# Patient Record
Sex: Female | Born: 1945 | Hispanic: Yes | Marital: Married | Smoking: Never smoker
Health system: Southern US, Community
[De-identification: ages and names within clinical notes are randomized; demographics above are authoritative.]

## PROBLEM LIST (undated history)

## (undated) DIAGNOSIS — M8580 Other specified disorders of bone density and structure, unspecified site: Secondary | ICD-10-CM

## (undated) DIAGNOSIS — E039 Hypothyroidism, unspecified: Secondary | ICD-10-CM

## (undated) DIAGNOSIS — N209 Urinary calculus, unspecified: Secondary | ICD-10-CM

## (undated) DIAGNOSIS — E559 Vitamin D deficiency, unspecified: Secondary | ICD-10-CM

## (undated) HISTORY — DX: Other specified disorders of bone density and structure, unspecified site: M85.80

## (undated) HISTORY — DX: Hypothyroidism, unspecified: E03.9

## (undated) HISTORY — DX: Vitamin D deficiency, unspecified: E55.9

## (undated) HISTORY — PX: CHOLECYSTECTOMY: SHX55

## (undated) HISTORY — DX: Urinary calculus, unspecified: N20.9

---

## 1988-12-28 HISTORY — PX: ABDOMINAL HYSTERECTOMY: SHX81

## 2003-12-29 HISTORY — PX: GASTRIC BYPASS: SHX52

## 2006-11-16 ENCOUNTER — Ambulatory Visit: Payer: Self-pay | Admitting: Internal Medicine

## 2006-11-16 LAB — CONVERTED CEMR LAB
ALT: 23 units/L (ref 0–40)
Alkaline Phosphatase: 107 units/L (ref 39–117)
BUN: 20 mg/dL (ref 6–23)
Calcium: 9.5 mg/dL (ref 8.4–10.5)
Glucose, Bld: 89 mg/dL (ref 70–99)
HCT: 41.8 % (ref 36.0–46.0)
Hemoglobin: 14.1 g/dL (ref 12.0–15.0)
MCHC: 33.7 g/dL (ref 30.0–36.0)
MCV: 93.4 fL (ref 78.0–100.0)
Potassium: 4 meq/L (ref 3.5–5.1)
TSH: 5.19 microintl units/mL (ref 0.35–5.50)
Total Protein: 6.8 g/dL (ref 6.0–8.3)
Vitamin B-12: >1500 pg/mL — ABNORMAL HIGH (ref 211–911)
WBC: 6.2 10*3/uL (ref 4.5–10.5)

## 2006-12-08 ENCOUNTER — Encounter: Admission: RE | Admit: 2006-12-08 | Discharge: 2006-12-08 | Payer: Self-pay | Admitting: Internal Medicine

## 2006-12-13 ENCOUNTER — Ambulatory Visit: Payer: Self-pay | Admitting: Internal Medicine

## 2006-12-31 ENCOUNTER — Encounter: Admission: RE | Admit: 2006-12-31 | Discharge: 2006-12-31 | Payer: Self-pay | Admitting: Obstetrics and Gynecology

## 2007-01-03 ENCOUNTER — Ambulatory Visit: Payer: Self-pay | Admitting: Internal Medicine

## 2007-12-21 ENCOUNTER — Ambulatory Visit: Payer: Self-pay | Admitting: Internal Medicine

## 2007-12-21 DIAGNOSIS — Z9884 Bariatric surgery status: Secondary | ICD-10-CM

## 2007-12-21 DIAGNOSIS — M8580 Other specified disorders of bone density and structure, unspecified site: Secondary | ICD-10-CM

## 2007-12-21 DIAGNOSIS — H409 Unspecified glaucoma: Secondary | ICD-10-CM | POA: Insufficient documentation

## 2007-12-21 DIAGNOSIS — E039 Hypothyroidism, unspecified: Secondary | ICD-10-CM | POA: Insufficient documentation

## 2007-12-21 LAB — CONVERTED CEMR LAB
Basophils Relative: 0.7 % (ref 0.0–1.0)
CO2: 32 meq/L (ref 19–32)
Calcium: 9.5 mg/dL (ref 8.4–10.5)
Chloride: 106 meq/L (ref 96–112)
Creatinine, Ser: 0.5 mg/dL (ref 0.4–1.2)
Eosinophils Relative: 4.2 % (ref 0.0–5.0)
Glucose, Bld: 94 mg/dL (ref 70–99)
Iron: 83 ug/dL (ref 42–145)
Lymphocytes Relative: 46.5 % — ABNORMAL HIGH (ref 12.0–46.0)
Platelets: 266 10*3/uL (ref 150–400)
RBC: 4.22 M/uL (ref 3.87–5.11)
RDW: 12.4 % (ref 11.5–14.6)
TSH: 3.32 microintl units/mL (ref 0.35–5.50)
Triglycerides: 63 mg/dL (ref 0–149)
VLDL: 13 mg/dL (ref 0–40)
WBC: 5 10*3/uL (ref 4.5–10.5)

## 2007-12-27 ENCOUNTER — Ambulatory Visit: Payer: Self-pay | Admitting: Internal Medicine

## 2007-12-27 ENCOUNTER — Emergency Department (HOSPITAL_COMMUNITY): Admission: EM | Admit: 2007-12-27 | Discharge: 2007-12-27 | Payer: Self-pay | Admitting: Emergency Medicine

## 2007-12-30 ENCOUNTER — Telehealth: Payer: Self-pay | Admitting: Internal Medicine

## 2008-01-02 ENCOUNTER — Ambulatory Visit: Payer: Self-pay | Admitting: Internal Medicine

## 2008-01-06 ENCOUNTER — Encounter: Payer: Self-pay | Admitting: Internal Medicine

## 2008-01-06 ENCOUNTER — Other Ambulatory Visit: Admission: RE | Admit: 2008-01-06 | Discharge: 2008-01-06 | Payer: Self-pay | Admitting: Gynecology

## 2008-01-12 ENCOUNTER — Telehealth (INDEPENDENT_AMBULATORY_CARE_PROVIDER_SITE_OTHER): Payer: Self-pay | Admitting: *Deleted

## 2008-02-09 ENCOUNTER — Encounter: Admission: RE | Admit: 2008-02-09 | Discharge: 2008-02-09 | Payer: Self-pay | Admitting: Internal Medicine

## 2008-02-20 ENCOUNTER — Encounter: Admission: RE | Admit: 2008-02-20 | Discharge: 2008-02-20 | Payer: Self-pay | Admitting: Internal Medicine

## 2008-02-21 ENCOUNTER — Encounter (INDEPENDENT_AMBULATORY_CARE_PROVIDER_SITE_OTHER): Payer: Self-pay | Admitting: *Deleted

## 2008-07-02 ENCOUNTER — Ambulatory Visit: Payer: Self-pay | Admitting: Internal Medicine

## 2008-07-03 LAB — CONVERTED CEMR LAB
Chloride: 109 meq/L (ref 96–112)
GFR calc Af Amer: 131 mL/min
GFR calc non Af Amer: 108 mL/min
Potassium: 4.9 meq/L (ref 3.5–5.1)
TSH: 2.92 microintl units/mL (ref 0.35–5.50)

## 2009-01-04 ENCOUNTER — Ambulatory Visit: Payer: Self-pay | Admitting: Internal Medicine

## 2009-01-04 DIAGNOSIS — E875 Hyperkalemia: Secondary | ICD-10-CM | POA: Insufficient documentation

## 2009-01-22 LAB — CONVERTED CEMR LAB
ALT: 16 units/L (ref 0–35)
AST: 22 units/L (ref 0–37)
Creatinine, Ser: 0.4 mg/dL (ref 0.4–1.2)
HDL: 63.4 mg/dL (ref >39.0)
Potassium: 4.4 meq/L (ref 3.5–5.1)
Triglycerides: 94 mg/dL (ref 0–149)

## 2009-06-03 ENCOUNTER — Ambulatory Visit: Payer: Self-pay | Admitting: Family Medicine

## 2009-06-03 ENCOUNTER — Encounter: Payer: Self-pay | Admitting: Internal Medicine

## 2009-06-25 ENCOUNTER — Encounter (INDEPENDENT_AMBULATORY_CARE_PROVIDER_SITE_OTHER): Payer: Self-pay | Admitting: *Deleted

## 2009-06-27 ENCOUNTER — Encounter: Admission: RE | Admit: 2009-06-27 | Discharge: 2009-06-27 | Payer: Self-pay | Admitting: Internal Medicine

## 2009-07-04 ENCOUNTER — Encounter (INDEPENDENT_AMBULATORY_CARE_PROVIDER_SITE_OTHER): Payer: Self-pay | Admitting: *Deleted

## 2009-09-06 ENCOUNTER — Ambulatory Visit: Payer: Self-pay | Admitting: Internal Medicine

## 2009-09-09 ENCOUNTER — Encounter (INDEPENDENT_AMBULATORY_CARE_PROVIDER_SITE_OTHER): Payer: Self-pay | Admitting: *Deleted

## 2009-09-11 LAB — CONVERTED CEMR LAB
ALT: 20 units/L (ref 0–35)
AST: 27 units/L (ref 0–37)
BUN: 15 mg/dL (ref 6–23)
Basophils Absolute: 0 10*3/uL (ref 0.0–0.1)
Basophils Relative: 0 % (ref 0.0–3.0)
CO2: 31 meq/L (ref 19–32)
Calcium: 9.2 mg/dL (ref 8.4–10.5)
Cholesterol: 174 mg/dL (ref 0–200)
Creatinine, Ser: 0.5 mg/dL (ref 0.4–1.2)
Eosinophils Absolute: 0.1 10*3/uL (ref 0.0–0.7)
HDL: 59.7 mg/dL (ref >39.00)
Lymphocytes Relative: 47 % — ABNORMAL HIGH (ref 12.0–46.0)
MCHC: 33.9 g/dL (ref 30.0–36.0)
MCV: 92.5 fL (ref 78.0–100.0)
Monocytes Absolute: 0.2 10*3/uL (ref 0.1–1.0)
Neutrophils Relative %: 46.7 % (ref 43.0–77.0)
Platelets: 225 10*3/uL (ref 150.0–400.0)
RBC: 4.47 M/uL (ref 3.87–5.11)
Rh Type: NEGATIVE
Total CHOL/HDL Ratio: 3
Triglycerides: 87 mg/dL (ref 0.0–149.0)
Vit D, 25-Hydroxy: 11 ng/mL — ABNORMAL LOW (ref 30–89)

## 2009-09-30 ENCOUNTER — Encounter: Payer: Self-pay | Admitting: Internal Medicine

## 2009-10-11 ENCOUNTER — Encounter: Payer: Self-pay | Admitting: Internal Medicine

## 2009-10-11 LAB — CONVERTED CEMR LAB

## 2009-10-25 ENCOUNTER — Ambulatory Visit: Payer: Self-pay | Admitting: Internal Medicine

## 2010-04-24 ENCOUNTER — Ambulatory Visit: Payer: Self-pay | Admitting: Internal Medicine

## 2010-04-29 LAB — CONVERTED CEMR LAB
CO2: 31 meq/L (ref 19–32)
Chloride: 106 meq/L (ref 96–112)
Creatinine, Ser: 0.5 mg/dL (ref 0.4–1.2)
Potassium: 4.6 meq/L (ref 3.5–5.1)
Sodium: 144 meq/L (ref 135–145)
TSH: 2.4 microintl units/mL (ref 0.35–5.50)

## 2010-06-27 ENCOUNTER — Ambulatory Visit: Payer: Self-pay | Admitting: Internal Medicine

## 2010-06-27 ENCOUNTER — Encounter (INDEPENDENT_AMBULATORY_CARE_PROVIDER_SITE_OTHER): Payer: Self-pay | Admitting: *Deleted

## 2010-06-27 DIAGNOSIS — R197 Diarrhea, unspecified: Secondary | ICD-10-CM

## 2010-10-14 ENCOUNTER — Ambulatory Visit: Payer: Self-pay | Admitting: Diagnostic Radiology

## 2010-10-14 ENCOUNTER — Ambulatory Visit (HOSPITAL_BASED_OUTPATIENT_CLINIC_OR_DEPARTMENT_OTHER): Admission: RE | Admit: 2010-10-14 | Discharge: 2010-10-14 | Payer: Self-pay | Admitting: Internal Medicine

## 2010-10-14 LAB — HM MAMMOGRAPHY: HM Mammogram: NEGATIVE

## 2011-01-15 ENCOUNTER — Encounter: Payer: Self-pay | Admitting: Internal Medicine

## 2011-01-18 ENCOUNTER — Encounter: Payer: Self-pay | Admitting: Obstetrics and Gynecology

## 2011-01-18 ENCOUNTER — Encounter: Payer: Self-pay | Admitting: Internal Medicine

## 2011-01-26 ENCOUNTER — Encounter: Payer: Self-pay | Admitting: Internal Medicine

## 2011-01-26 ENCOUNTER — Other Ambulatory Visit: Payer: Self-pay | Admitting: Gastroenterology

## 2011-01-27 NOTE — Assessment & Plan Note (Signed)
Summary: 6 MONTH OV//PH   Vital Signs:  Patient profile:   65 year old female Height:      60.5 inches Weight:      149.5 pounds BMI:     28.82 Pulse rate:   62 / minute BP sitting:   104 / 60  Vitals Entered By: Shary Decamp (April 24, 2010 7:57 AM) CC: rov   History of Present Illness: routine office visit several months history of pain on the right side of the neck and upper trapezoid area;  patient thinks is related to the posture at work----> bends forward  constantly  Current Medications (verified): 1)  Synthroid 50 Mcg  Tabs (Levothyroxine Sodium) .Marland Kitchen.. 1 By Mouth Qd 2)  Ca and Vit D  Allergies (verified): No Known Drug Allergies  Past History:  Past Medical History: Reviewed history from 01/04/2009 and no changes required. Hypothyroidism glaucoma ?urolithiasis 2007, 2009  Osteopenia  Past Surgical History: Reviewed history from 09/06/2009 and no changes required. 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   Neck:  full ROM.  no tender to palpation at the cervical spine Lungs:  normal respiratory effort, no intercostal retractions, no accessory muscle use, and normal breath sounds.   Heart:  normal rate, regular rhythm, no murmur, and no gallop.   Msk:  not tender to palpation at the trapezoid  muscle area Neurologic:  alert & oriented X3, strength normal in all extremities, and DTRs symmetrical and normal.     Impression &  Recommendations:  Problem # 1:  NECK PAIN (ICD-723.1)  see HPI, likely related to posture recommend Motrin and Flexeril at bedtime  discussed appropriate posture at work if not better, advised to   call for a referral (Dr. Christell Faith)  Her updated medication list for this problem includes:    Flexeril 10 Mg Tabs (Cyclobenzaprine hcl) ..... One by mouth at night as needed for pain30  Problem # 2:  HYPERKALEMIA (ICD-276.7)  repeat BMP  Orders: Venipuncture (33295) TLB-BMP (Basic Metabolic Panel-BMET) (80048-METABOL)  Problem # 3:  OSTEOPENIA (ICD-733.90)  Dexa 12-2006 and 05-2009: osteopenia  05-2009 Tscore -0.9 and -1.5 counseled about Ca and Vit D  and option of medication her last vitamin D was low, will re-issue ergocalciferol prescription  Her updated medication list for this problem includes:    Ergocalciferol 50000 Unit Caps (Ergocalciferol) ..... One tablet weekly for 3 months  Problem # 4:  HYPOTHYROIDISM (ICD-244.9)  Her updated medication list for this problem includes:    Synthroid 50 Mcg Tabs (Levothyroxine sodium) .Marland Kitchen... 1 by mouth qd  Labs Reviewed: TSH: 0.59 (09/06/2009)    Chol: 174 (09/06/2009)   HDL: 59.70 (09/06/2009)   LDL: 97 (09/06/2009)   TG: 87.0 (09/06/2009)  Orders: TLB-TSH (Thyroid Stimulating Hormone) (84443-TSH)  Problem # 5:  get records from gynecology  Complete Medication List: 1)  Synthroid 50 Mcg Tabs (Levothyroxine sodium) .Marland Kitchen.. 1 by mouth qd 2)  Ca and Vit D  3)  Ergocalciferol 50000 Unit Caps (Ergocalciferol) .Marland KitchenMarland KitchenMarland Kitchen  One tablet weekly for 3 months 4)  Flexeril 10 Mg Tabs (Cyclobenzaprine hcl) .... One by mouth at night as needed for pain30  Patient Instructions: 1)  Please schedule a follow-up appointment in 6 months (fasting, physical) Prescriptions: SYNTHROID 50 MCG  TABS (LEVOTHYROXINE SODIUM) 1 by mouth QD  #90 x 3   Entered and Authorized by:   Nolon Rod. Youssouf Shipley MD   Signed by:   Nolon Rod. Raul Winterhalter MD on 04/24/2010   Method used:   Print then  Give to Patient   RxID:   [id number] FLEXERIL 10 MG TABS (CYCLOBENZAPRINE HCL) one by mouth at night as needed for pain30  #30 x 1   Entered and Authorized by:   Elita Quick E. Juan Kissoon MD   Signed by:   Nolon Rod. Bralynn Donado MD on 04/24/2010   Method used:   Print then Give to Patient   RxID:   [id number] ERGOCALCIFEROL 50000 UNIT CAPS (ERGOCALCIFEROL) one tablet weekly for 3 months  #12 x 0   Entered and Authorized by:   Nolon Rod. Kendyl Bissonnette MD   Signed by:   Nolon Rod. Aikeem Lilley MD on 04/24/2010   Method used:   Print then Give to Patient   RxID:   [id number]

## 2011-01-27 NOTE — Assessment & Plan Note (Signed)
Summary: walk-in/diarhhea//kn   Vital Signs:  Patient profile:   65 year old female Weight:      146.50 pounds Temp:     98.6 degrees F oral Pulse rhythm:   regular BP sitting:   112 / 74  (left arm) Cuff size:   regular  Vitals Entered By: Army Fossa CMA (June 27, 2010 8:24 AM)  O2 Flow:  64 CC: C/o diarrhea since Tuesday- getting worse. Comments No vomitting.   History of Present Illness: watery diarrhea for 4 days No blood in the stools, no mucus No recent  trips outside the city, no other family members or coworkers affected w/ diarrhea  that she knows of patient is drinking plenty of fluids, has not taken any medicines other than what is prescribed  ROS No fever No nausea or vomiting Some headaches Appetite decrease very mild abdominal discomfort  Allergies (verified): No Known Drug Allergies  Past History:  Past Medical History: Reviewed history from 01/04/2009 and no changes required. Hypothyroidism glaucoma ?urolithiasis 2007, 2009  Osteopenia  Past Surgical History: Reviewed history from 09/06/2009 and no changes required. gastric bypass 2005, [place]  Cholecystectomy Hysterectomy , no oophorectomy (1990s)  Social History: Reviewed history from 04/24/2010 and no changes required. 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  nontoxic Mouth:  membranes slightly dry Abdomen:  soft, normal bowel sounds, no distention, no masses, no guarding, and no rigidity.  very mild tenderness throughout   Impression & Recommendations:  Problem # 1:  DIARRHEA (ICD-787.91) acute diarrhea Probably viral, anticipate it will get better soon Plan: Rest, fluids, Pepto-Bismol, will call if no better or go to the UC  if worse. see  instructions in Spanish  Complete Medication List: 1)  Synthroid 50 Mcg Tabs (Levothyroxine  sodium) .Marland Kitchen.. 1 by mouth qd 2)  Ca and Vit D  3)  Ergocalciferol 50000 Unit Caps (Ergocalciferol) .... One tablet weekly for 3 months 4)  Flexeril 10 Mg Tabs (Cyclobenzaprine hcl) .... One by mouth at night as needed for pain30  Patient Instructions: 1)  descanse 2)  tome muchos liquidos: caldo de pollo, Gatorade, crakers 3)  peptobismol OTC  4)  llame si no esta mejor en unos dias 5)  vaya al "Urgent Care" este fin de semana si se pone peor

## 2011-01-27 NOTE — Letter (Signed)
Summary: Out of Work  [facility]  [address] [place], [place] [address]   Phone: [phone]  Fax: [phone]    June 27, 2010   Employee:  REINA WILTON    To Whom It May Concern:   For Medical reasons, please excuse the above named employee from work for the following dates:  Start:   June 24, 2010  End:   June 27, 2010  If you need additional information, please feel free to contact our office.         Sincerely,    Willow Ora, MD

## 2011-01-27 NOTE — Miscellaneous (Signed)
Summary: PAP from 10-10  Clinical Lists Changes  Observations: Added new observation of PAP SMEAR: Specimen Adequacy: Satisfactory for evaluation.   Interpretation/Result:Negative for intraepithelial Lesion or Malignancy.    (10/11/2009 8:15)      Pap Smear  Procedure date:  10/11/2009  Findings:      Specimen Adequacy: Satisfactory for evaluation.   Interpretation/Result:Negative for intraepithelial Lesion or Malignancy.      Pap Smear  Procedure date:  10/11/2009  Findings:      Specimen Adequacy: Satisfactory for evaluation.   Interpretation/Result:Negative for intraepithelial Lesion or Malignancy.

## 2011-01-29 ENCOUNTER — Encounter: Payer: Self-pay | Admitting: Internal Medicine

## 2011-01-30 ENCOUNTER — Ambulatory Visit
Admission: RE | Admit: 2011-01-30 | Discharge: 2011-01-30 | Disposition: A | Discharge status: Home / Self Care | Payer: Self-pay | Source: Ambulatory Visit | Attending: Gastroenterology | Admitting: Gastroenterology

## 2011-01-30 ENCOUNTER — Encounter: Payer: Self-pay | Admitting: Internal Medicine

## 2011-02-12 NOTE — Letter (Signed)
Summary: RX EGD  CScope----Dr Loreta Ave, GI  [facility]   Imported By: Lanelle Bal 01/29/2011 09:05:57  _____________________________________________________________________  External Attachment:    Type:   Image     Comment:   External Document

## 2011-02-18 NOTE — Procedures (Signed)
Summary: Colonoscopy-- hemorrhoid, tics  Colonoscopy/[facility]   Imported By: Sherian Rein 02/03/2011 13:57:44  _____________________________________________________________________  External Attachment:    Type:   Image     Comment:   External Document

## 2011-02-18 NOTE — Procedures (Signed)
Summary: EGD-- pseudo diverticulum? see report   EGD/[facility]   Imported By: Sherian Rein 02/03/2011 13:59:02  _____________________________________________________________________  External Attachment:    Type:   Image     Comment:   External Document

## 2011-02-19 ENCOUNTER — Encounter: Payer: Self-pay | Admitting: Internal Medicine

## 2011-02-25 ENCOUNTER — Encounter: Payer: Self-pay | Admitting: Internal Medicine

## 2011-03-24 ENCOUNTER — Encounter: Payer: Self-pay | Admitting: Internal Medicine

## 2011-03-24 ENCOUNTER — Ambulatory Visit (INDEPENDENT_AMBULATORY_CARE_PROVIDER_SITE_OTHER): Payer: BC Managed Care – PPO | Admitting: Internal Medicine

## 2011-03-24 DIAGNOSIS — M899 Disorder of bone, unspecified: Secondary | ICD-10-CM

## 2011-03-24 DIAGNOSIS — R209 Unspecified disturbances of skin sensation: Secondary | ICD-10-CM

## 2011-03-24 DIAGNOSIS — M949 Disorder of cartilage, unspecified: Secondary | ICD-10-CM

## 2011-03-24 DIAGNOSIS — R202 Paresthesia of skin: Secondary | ICD-10-CM | POA: Insufficient documentation

## 2011-03-24 DIAGNOSIS — E039 Hypothyroidism, unspecified: Secondary | ICD-10-CM

## 2011-03-24 DIAGNOSIS — Z0000 Encounter for general adult medical examination without abnormal findings: Secondary | ICD-10-CM | POA: Insufficient documentation

## 2011-03-24 LAB — LIPID PANEL
Cholesterol: 157 mg/dL (ref 0–200)
Triglycerides: 79 mg/dL (ref 0.0–149.0)

## 2011-03-24 LAB — BASIC METABOLIC PANEL
Calcium: 9.2 mg/dL (ref 8.4–10.5)
GFR: 126.43 mL/min (ref >60.00)
Potassium: 5.3 mEq/L — ABNORMAL HIGH (ref 3.5–5.1)
Sodium: 143 mEq/L (ref 135–145)

## 2011-03-24 LAB — TSH: TSH: 1.75 u[IU]/mL (ref 0.35–5.50)

## 2011-03-24 LAB — ALT: ALT: 17 U/L (ref 0–35)

## 2011-03-24 MED ORDER — ZOSTER VACCINE LIVE 19400 UNT/0.65ML ~~LOC~~ SOLR
0.6500 mL | Freq: Once | SUBCUTANEOUS | Status: AC
  Administered 2011-03-24: 19400 [IU] via SUBCUTANEOUS

## 2011-03-24 NOTE — Assessment & Plan Note (Addendum)
Td 2007 Shingles inmunization discussed -- gave one today Cscope 2005, no polyps per patient ([place])  , again had a Cscope w/ Dr Loreta Ave 12-2010, tics, no polyps, next in 10 years per report  last  MMG neg 09-2010 s/p hyst: no h/o cancer or abnormal PAP, saw Dr Ernestina Penna 2010 and DR Neva Seat [phone] for her female exam Labs: had a CBC LFTs 12-2010 at GI diet-exercise discussed

## 2011-03-24 NOTE — Assessment & Plan Note (Signed)
New problem Neurological exam normal Has a mild on and off neck pain without radiation. Carpal tunnel?  cubital tunnel syndrome?  Will prescribe CTS splinter,  if not better consider further workup such as nerve conduction study

## 2011-03-24 NOTE — Assessment & Plan Note (Signed)
Due for labs

## 2011-03-24 NOTE — Progress Notes (Signed)
  Subjective:    Patient ID: Angela Valencia, female    DOB: Aug 14, 1946, 65 y.o.   MRN: [id number]  HPI CPX  S/p GI eval, for dysphagia , had a Cscope , results reviewed Also complaints of left hand numbness, mostly first thing in the morning, gradually resolve.  the numbness is in both sides of the hand, she has neck pain from time to time without radiation. Denies any elbow pain or injury.   due for a TSH check     Review of Systems  Constitutional: Negative for fever and fatigue.  Respiratory: Negative for cough and shortness of breath.   Cardiovascular: Negative for chest pain and palpitations.  Gastrointestinal: Negative for nausea, vomiting, diarrhea and blood in stool.  Genitourinary: Negative for dysuria and hematuria.  Psychiatric/Behavioral:       No anxiety , depression      Past Medical History  Diagnosis Date  . Urolithiasis 2007,2009  . Hypothyroidism   . Osteopenia   . Glaucoma     Past Surgical History  Procedure Date  . Gastric bypass 2005    [place]  . Cholecystectomy   . Abdominal hysterectomy 1990    no oophorectomy   Family History: Reviewed history from 12/21/2007 and no changes required. Stroke - F +skin Ca CAD +? DM-no Colon Ca - no Breast Ca - no  Social History: Married 2 kids from [place] Tobacco-never ETOH-- rarely         Objective:   Physical Exam  Constitutional: She appears well-developed and well-nourished.  Neck: Normal range of motion. Neck supple.  Cardiovascular: Normal rate, regular rhythm and normal heart sounds.   Pulmonary/Chest: Effort normal and breath sounds normal. No respiratory distress. She has no wheezes. She has no rales.  Abdominal: Bowel sounds are normal. She exhibits no distension and no mass. There is no tenderness. There is no rebound and no guarding.  Musculoskeletal:        Shoulders and elbows wait normal range of motion.  Elbows and wrists normal to inspection and palpation.  Neurological:         Upper extremities with normal strength, motor tone and DTRs          Assessment & Plan:

## 2011-03-24 NOTE — Patient Instructions (Signed)
Get a carpal tunnel syndrome splinter from the pharmacy, use it in your left hand for a month every night. If numbness is not improving, let me know  please read the information about healthy diet  exercise 30 minutes daily

## 2011-03-24 NOTE — Assessment & Plan Note (Signed)
Dexa 12-2006 and 05-2009: osteopenia  05-2009 Tscore -0.9 and -1.5 counseled about Ca and Vit D  and option of medication H/o low  vitamin D,  Was Rx ergocalciferol   Labs

## 2011-03-26 ENCOUNTER — Telehealth: Payer: Self-pay | Admitting: Internal Medicine

## 2011-03-26 MED ORDER — ERGOCALCIFEROL 1.25 MG (50000 UT) PO CAPS: 50000.0000 [IU] | ORAL_CAPSULE | ORAL | Status: DC

## 2011-03-26 MED ORDER — LEVOTHYROXINE SODIUM 50 MCG PO TABS: 50.0000 ug | ORAL_TABLET | Freq: Every day | ORAL | Status: DC

## 2011-03-26 NOTE — Telephone Encounter (Signed)
I spoke with the patient's  Husband with the patient's permission --TSH okay, RF synthroid --potassium is slightly elevated, I recommend to discontinue the banana that she eats daily, will also mail  a low potassium diet --She is to come back in one month for a K+check, DX hyperkalemia. They will call for an appointment --Her vitamin D is low, I am sending a prescription for ergocalciferol. Patient aware

## 2011-03-27 ENCOUNTER — Other Ambulatory Visit: Payer: Self-pay | Admitting: Internal Medicine

## 2011-03-27 MED ORDER — VITAMIN B-12 100 MCG PO TABS: 100.0000 ug | ORAL_TABLET | Freq: Every day | ORAL | Status: DC

## 2011-03-27 MED ORDER — VITAMIN B-12 100 MCG PO TABS: 50.0000 ug | ORAL_TABLET | Freq: Every day | ORAL | Status: DC

## 2011-03-27 NOTE — Telephone Encounter (Signed)
I think I just  RF it, if I didn't : ok  to call 6 months

## 2011-03-27 NOTE — Telephone Encounter (Signed)
Originally ordered Vit B12 .  The lowest strength is .  Please resubmith with correct strength

## 2011-04-27 ENCOUNTER — Other Ambulatory Visit (INDEPENDENT_AMBULATORY_CARE_PROVIDER_SITE_OTHER): Payer: BC Managed Care – PPO

## 2011-04-27 DIAGNOSIS — E875 Hyperkalemia: Secondary | ICD-10-CM

## 2011-04-27 LAB — POTASSIUM: Potassium: 4.5 mEq/L (ref 3.5–5.1)

## 2011-04-29 ENCOUNTER — Encounter: Payer: Self-pay | Admitting: Internal Medicine

## 2011-05-08 NOTE — Progress Notes (Signed)
Addended by: Floydene Flock on: 05/08/2011 11:51 AM   Modules accepted: Orders

## 2011-05-15 NOTE — Assessment & Plan Note (Signed)
[facility]                          [facility] OFFICE NOTE   NAME:[place], [place]                         MRN:          [id number]  DATE:11/16/2006                            DOB:          05-29-1946    CHIEF COMPLAINT:  Here to establish and complete physical.   HISTORY:  Mrs. Harkless is a 65 year old Hispanic female originally from  [place] who comes to the office for the first time to get established and  get a complete physical.  The last time she saw a doctor was two years ago  when she was living in [place].  In general, she is doing well.  She did  report occasional low back pain and a two year history of occasionally  feeling hot, slightly dizzy and diaphoretic.  She reports no chest pain or  focal neurological symptoms during these spells that last about 5 minutes.   PAST MEDICAL HISTORY:  1. Hypothyroidism.  2. Glaucoma.  3. In 2005 she had gastric bypass.  Her baseline weight was 220 pounds.  4. Hysterectomy without oophorectomy, remotely.  5. Cholecystectomy.  6. Reports a colonoscopy in 2005.  She was told that she had no polyps.  7. She went to the emergency room earlier this year and she was told that      she had kidney stones.   FAMILY HISTORY:  1. No breast or colon cancer that the patient knows of.  2. No diabetes.  3. Questionable history of coronary artery disease in the family.  4. Her father had a stroke at age 68.   SOCIAL HISTORY:  The patient does not smoke, drinks very rarely.  She is  married and has two kids.   MEDICATIONS:  1. Synthroid 50 mcg once a day. She has been skipping a lot lately because      she didn't have her supply of medicines.  2. Alphagan for glaucoma.   REVIEW OF SYSTEMS:  She denies any chest pain, shortness of breath, cough,  nausea, vomiting, diarrhea, no vaginal discharge or bleeding, no dysuria or  bladder incontinence.   ALLERGIES:  No known drug allergies.   PHYSICAL  EXAMINATION:  GENERAL APPEARANCE:  The patient is alert, oriented  and in no apparent distress.  She weighs 137 pounds.  She is 5 feet, 3/4  inches tall.  VITAL SIGNS:  Pulse 56, blood pressure 122/80.  HEENT:  Oropharynx is normal.  Ears are normal.  NECK:  No thyromegaly.  LUNGS:  Clear to auscultation bilaterally.  CARDIOVASCULAR:  Regular rate and rhythm without murmurs.  Carotids are  palpate and they feel normal.  ABDOMEN:  Soft, not distended, good bowel sounds, no organomegaly.  I  believe I am able to palpate the aorta which is nontender and there is no  bruit.  EXTREMITIES: No edema.  GENITOURINARY/BREAST:  No particular masses or axillary lymphadenopathy.  NEUROLOGICAL:  Speech, gait and motor are intact.   ASSESSMENT/PLAN:  1. The patient is here for a complete physical examination.  2. Will provide her with a flu shot and a tetanus shot.  3. She is encouraged to have a low fat diet and exercise daily.  4. Dental check up yearly is also encouraged.  5. Will refer her for mammogram and will ask her to get the records for      her colonoscopy.  6. Electrocardiogram was normal.  7. Will check CBC, comprehensive panel, TSH, fasting lipid profile,      urinalysis, B12 and folic acid levels.  8. For her glaucoma, will refer her to a local ophthalmologist.  9. For her thyroid disease, will check the level today.  Refilled her      Synthroid 50 mcg and asked her to come back in six weeks as most likely      she will need a recheck of thyroid then.  10.She has episodes of what seems to be hot flashes.  At this time will      simply observe.     Willow Ora, MD  Electronically Signed    JP/MedQ  DD: 11/16/2006  DT: 11/16/2006  Job #: [id number]

## 2011-05-29 NOTE — Progress Notes (Signed)
K+ is now ok.

## 2011-07-22 ENCOUNTER — Encounter: Payer: Self-pay | Admitting: *Deleted

## 2011-08-19 ENCOUNTER — Other Ambulatory Visit: Payer: Self-pay | Admitting: *Deleted

## 2011-08-19 MED ORDER — LEVOTHYROXINE SODIUM 50 MCG PO TABS: 50.0000 ug | ORAL_TABLET | Freq: Every day | ORAL | Status: DC

## 2011-09-18 ENCOUNTER — Encounter: Payer: Self-pay | Admitting: Internal Medicine

## 2011-09-18 ENCOUNTER — Ambulatory Visit (INDEPENDENT_AMBULATORY_CARE_PROVIDER_SITE_OTHER): Payer: BC Managed Care – PPO | Admitting: Internal Medicine

## 2011-09-18 DIAGNOSIS — E039 Hypothyroidism, unspecified: Secondary | ICD-10-CM

## 2011-09-18 DIAGNOSIS — E559 Vitamin D deficiency, unspecified: Secondary | ICD-10-CM

## 2011-09-18 DIAGNOSIS — M899 Disorder of bone, unspecified: Secondary | ICD-10-CM

## 2011-09-18 DIAGNOSIS — M949 Disorder of cartilage, unspecified: Secondary | ICD-10-CM

## 2011-09-18 NOTE — Progress Notes (Signed)
  Subjective:    Patient ID: Angela Valencia, female    DOB: 12/23/1946, 65 y.o.   MRN: [id number]  HPI  Routine office visit, doing well, has gained some weight, recognizes is exercising less lately    Past Medical History  Diagnosis Date  . Urolithiasis 2007,2009  . Hypothyroidism   . Osteopenia   . Glaucoma    Past Surgical History  Procedure Date  . Gastric bypass 2005    [place]  . Cholecystectomy   . Abdominal hysterectomy 1990    no oophorectomy   Review of Systems Good medication compliance with vitamin D as prescribed. Good medication compliance with Synthroid. some stress, her husband (who is my patient) has some health issues. We discussed them, counseled Follows a low potassium diet, potassium was elevated at some point a few months ago      Objective:   Physical Exam  Constitutional: She is oriented to person, place, and time. She appears well-developed and well-nourished.  HENT:  Head: Normocephalic and atraumatic.  Cardiovascular: Normal rate, regular rhythm and normal heart sounds.   No murmur heard. Pulmonary/Chest: Effort normal and breath sounds normal.  Neurological: She is alert and oriented to person, place, and time.  Psychiatric: She has a normal mood and affect. Her behavior is normal. Judgment and thought content normal.          Assessment & Plan:

## 2011-09-18 NOTE — Patient Instructions (Signed)
rec flu shot in October

## 2011-09-18 NOTE — Assessment & Plan Note (Signed)
Labs vitamin  D low, status post  replacement, recheck. Encourage exercise

## 2011-09-18 NOTE — Assessment & Plan Note (Signed)
Good medication compliance, well controlled  

## 2011-09-24 ENCOUNTER — Telehealth: Payer: Self-pay | Admitting: *Deleted

## 2011-09-24 MED ORDER — ERGOCALCIFEROL 1.25 MG (50000 UT) PO CAPS: 50000.0000 [IU] | ORAL_CAPSULE | ORAL | Status: DC

## 2011-09-24 NOTE — Telephone Encounter (Signed)
Patient Informed. New Rx sent to local pharmacy. Results Mailed.

## 2011-09-24 NOTE — Telephone Encounter (Signed)
Message copied by Regis Bill on Thu Sep 24, 2011  4:24 PM ------      Message from: Angela Valencia      Created: Tue Sep 22, 2011  6:02 PM       Advise patient, continue with low vitamin D.      Call in  ergocalciferol 50,000 units one tablet weekly for 3 months #12 no refills.      After ergocalciferol, she needs to take vitamin D over-the-counter 1000 units daily.

## 2011-10-02 LAB — DIFFERENTIAL
Basophils Absolute: 0.1
Basophils Relative: 1
Eosinophils Absolute: 0.1
Eosinophils Relative: 2
Lymphocytes Relative: 31
Monocytes Absolute: 0.3

## 2011-10-02 LAB — BASIC METABOLIC PANEL
BUN: 16
CO2: 29
GFR calc non Af Amer: >60
Glucose, Bld: 92
Potassium: 4.4

## 2011-10-02 LAB — POCT CARDIAC MARKERS
Myoglobin, poc: 32.8
Operator id: 198171
Operator id: 294501
Troponin i, poc: <0.05
Troponin i, poc: <0.05

## 2011-10-02 LAB — CBC
HCT: 35 — ABNORMAL LOW
MCHC: 34.7
MCV: 89.5
Platelets: 250
RDW: 12.9

## 2011-10-02 LAB — D-DIMER, QUANTITATIVE: D-Dimer, Quant: 0.42

## 2011-11-30 ENCOUNTER — Other Ambulatory Visit: Payer: Self-pay | Admitting: Internal Medicine

## 2011-11-30 ENCOUNTER — Ambulatory Visit (HOSPITAL_BASED_OUTPATIENT_CLINIC_OR_DEPARTMENT_OTHER)
Admission: RE | Admit: 2011-11-30 | Discharge: 2011-11-30 | Disposition: A | Discharge status: Home / Self Care | Payer: BC Managed Care – PPO | Source: Ambulatory Visit | Attending: Internal Medicine | Admitting: Internal Medicine

## 2011-11-30 DIAGNOSIS — Z1231 Encounter for screening mammogram for malignant neoplasm of breast: Secondary | ICD-10-CM | POA: Insufficient documentation

## 2012-03-17 ENCOUNTER — Encounter: Payer: Self-pay | Admitting: Internal Medicine

## 2012-03-17 ENCOUNTER — Ambulatory Visit (INDEPENDENT_AMBULATORY_CARE_PROVIDER_SITE_OTHER): Payer: BC Managed Care – PPO | Admitting: Internal Medicine

## 2012-03-17 DIAGNOSIS — H409 Unspecified glaucoma: Secondary | ICD-10-CM

## 2012-03-17 DIAGNOSIS — E039 Hypothyroidism, unspecified: Secondary | ICD-10-CM

## 2012-03-17 DIAGNOSIS — M899 Disorder of bone, unspecified: Secondary | ICD-10-CM

## 2012-03-17 DIAGNOSIS — Z0000 Encounter for general adult medical examination without abnormal findings: Secondary | ICD-10-CM

## 2012-03-17 DIAGNOSIS — Z9884 Bariatric surgery status: Secondary | ICD-10-CM

## 2012-03-17 LAB — CBC WITH DIFFERENTIAL/PLATELET
Basophils Absolute: 0.1 10*3/uL (ref 0.0–0.1)
Eosinophils Relative: 4.2 % (ref 0.0–5.0)
Lymphocytes Relative: 38.9 % (ref 12.0–46.0)
Monocytes Relative: 3.5 % (ref 3.0–12.0)
Neutrophils Relative %: 52 % (ref 43.0–77.0)
Platelets: 253 10*3/uL (ref 150.0–400.0)
RDW: 14.1 % (ref 11.5–14.6)
WBC: 5.4 10*3/uL (ref 4.5–10.5)

## 2012-03-17 LAB — COMPREHENSIVE METABOLIC PANEL
ALT: 16 U/L (ref 0–35)
CO2: 30 mEq/L (ref 19–32)
Calcium: 8.9 mg/dL (ref 8.4–10.5)
Chloride: 102 mEq/L (ref 96–112)
GFR: 113.37 mL/min (ref >60.00)
Glucose, Bld: 89 mg/dL (ref 70–99)
Sodium: 138 mEq/L (ref 135–145)
Total Bilirubin: 0.4 mg/dL (ref 0.3–1.2)
Total Protein: 6.9 g/dL (ref 6.0–8.3)

## 2012-03-17 LAB — LIPID PANEL
Cholesterol: 167 mg/dL (ref 0–200)
HDL: 64.4 mg/dL (ref >39.00)
LDL Cholesterol: 86 mg/dL (ref 0–99)
Triglycerides: 85 mg/dL (ref 0.0–149.0)
VLDL: 17 mg/dL (ref 0.0–40.0)

## 2012-03-17 MED ORDER — LEVOTHYROXINE SODIUM 50 MCG PO TABS: 50.0000 ug | ORAL_TABLET | Freq: Every day | ORAL | Status: DC

## 2012-03-17 NOTE — Assessment & Plan Note (Addendum)
Td 2007 Shingles inmunization 2012 Cscope 2005, no polyps per patient ([place])  , again had a Cscope w/ Dr Loreta Ave 12-2010, tics, no polyps, next in 10 years per report  last  MMG neg 11-2011 s/p hyst: no h/o cancer or abnormal PAP, saw Dr Ernestina Penna 2010 and DR Neva Seat [phone] and ~ 10-2011 diet-exercise discussed  Scalp lesion likely a lipoma, advised patient to call me if it is increasing in size or hurting Labs

## 2012-03-17 NOTE — Progress Notes (Signed)
  Subjective:    Patient ID: Angela Valencia, female    DOB: 1946/06/17, 66 y.o.   MRN: [id number]  HPI CPX  Past Medical History  Diagnosis Date  . Urolithiasis 2007,2009  . Hypothyroidism   . Osteopenia   . Glaucoma    Past Surgical History  Procedure Date  . Gastric bypass 2005    [place]  . Cholecystectomy   . Abdominal hysterectomy 1990    no oophorectomy   History   Social History  . Marital Status: Married    Spouse Name: N/A    Number of Children: 2  . 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 Smoking status: Never Smoker   . Smokeless tobacco: Never Used  . Alcohol Use: Yes     rarely  . Drug Use: Not on file  . Sexually Active: Not on file   Other Topics Concern  . Not on file   Social History Narrative   From [place], married to [employer] x 38 years, husband has dementia, diet improved lately , exercises very rarely (no time)   Family History  Problem Relation Age of Onset  . Stroke Father   . Skin cancer    . Coronary artery disease Neg Hx   . Diabetes Neg Hx   . Cancer Neg Hx     colon,prostate, breast    Review of Systems has noted a "knot" in the  scalp, concerned about it. No chest pain, shortness of breath. No nausea, vomiting or diarrhea. No blood in the stools. No dysuria or gross hematuria. Nonstenotic aortic depression although she recognizes she is sometimes stressed out by her husband's dementia    Objective:   Physical Exam  General -- alert, well-developed, and well-nourished.   Neck --no thyromegaly , normal carotid pulse, no LADs Lungs -- normal respiratory effort, no intercostal retractions, no accessory muscle use, and normal breath sounds.   Heart-- normal rate, regular rhythm, no murmur, and no gallop.   Abdomen--soft, non-tender, no distention, no masses, no HSM, no guarding, and no rigidity.   Extremities-- no pretibial edema bilaterally Skin-- at the R side  of the scalp has a 1 cm soft mass, mobile , not attached to deeper streuctures (likely a lipoma) Neurologic-- alert & oriented X3 and strength normal in all extremities. Psych-- Cognition and judgment appear intact. Alert and cooperative with normal attention span and concentration.  not anxious appearing and not depressed appearing.       Assessment & Plan:

## 2012-03-17 NOTE — Assessment & Plan Note (Signed)
Good compliance with medicines. Labs

## 2012-03-17 NOTE — Assessment & Plan Note (Addendum)
Her eye doctor d/c drops, recommend to followup with him/her to be sure the pressure is ok

## 2012-03-17 NOTE — Assessment & Plan Note (Signed)
Will check vitamins, also c/o occ difficulty swallowing Plan-- refer to Dr Loreta Ave, dx dysphagia

## 2012-03-17 NOTE — Assessment & Plan Note (Signed)
Status post vitamin D replacement. Last bone density test 2010, will order a bone density test. Check vitamin D

## 2012-03-18 LAB — VITAMIN D 25 HYDROXY (VIT D DEFICIENCY, FRACTURES): Vit D, 25-Hydroxy: 23 ng/mL — ABNORMAL LOW (ref 30–89)

## 2012-03-21 MED ORDER — ERGOCALCIFEROL 1.25 MG (50000 UT) PO CAPS: 50000.0000 [IU] | ORAL_CAPSULE | ORAL | Status: DC

## 2012-03-21 NOTE — Progress Notes (Signed)
Addended by: Edwena Felty T on: 03/21/2012 05:16 PM   Modules accepted: Orders

## 2012-03-24 ENCOUNTER — Other Ambulatory Visit: Payer: Self-pay | Admitting: Internal Medicine

## 2012-03-24 NOTE — Telephone Encounter (Signed)
Refill done.  

## 2012-03-28 ENCOUNTER — Ambulatory Visit (INDEPENDENT_AMBULATORY_CARE_PROVIDER_SITE_OTHER)
Admission: RE | Admit: 2012-03-28 | Discharge: 2012-03-28 | Disposition: A | Discharge status: Home / Self Care | Payer: BC Managed Care – PPO | Source: Ambulatory Visit

## 2012-03-28 DIAGNOSIS — M899 Disorder of bone, unspecified: Secondary | ICD-10-CM

## 2012-04-28 ENCOUNTER — Ambulatory Visit (HOSPITAL_BASED_OUTPATIENT_CLINIC_OR_DEPARTMENT_OTHER)
Admission: RE | Admit: 2012-04-28 | Discharge: 2012-04-28 | Disposition: A | Discharge status: Home / Self Care | Payer: BC Managed Care – PPO | Source: Ambulatory Visit | Attending: Internal Medicine | Admitting: Internal Medicine

## 2012-04-28 ENCOUNTER — Ambulatory Visit (INDEPENDENT_AMBULATORY_CARE_PROVIDER_SITE_OTHER): Payer: BC Managed Care – PPO | Admitting: Internal Medicine

## 2012-04-28 ENCOUNTER — Inpatient Hospital Stay: Admission: RE | Admit: 2012-04-28 | Payer: BC Managed Care – PPO | Source: Ambulatory Visit

## 2012-04-28 VITALS — BP 130/68 | HR 56 | Temp 97.6°F | Wt 151.0 lb

## 2012-04-28 DIAGNOSIS — F29 Unspecified psychosis not due to a substance or known physiological condition: Secondary | ICD-10-CM | POA: Insufficient documentation

## 2012-04-28 DIAGNOSIS — R5381 Other malaise: Secondary | ICD-10-CM | POA: Insufficient documentation

## 2012-04-28 DIAGNOSIS — R51 Headache: Secondary | ICD-10-CM | POA: Insufficient documentation

## 2012-04-28 DIAGNOSIS — R42 Dizziness and giddiness: Secondary | ICD-10-CM | POA: Insufficient documentation

## 2012-04-28 DIAGNOSIS — J3489 Other specified disorders of nose and nasal sinuses: Secondary | ICD-10-CM | POA: Insufficient documentation

## 2012-04-28 DIAGNOSIS — G319 Degenerative disease of nervous system, unspecified: Secondary | ICD-10-CM

## 2012-04-28 DIAGNOSIS — G930 Cerebral cysts: Secondary | ICD-10-CM | POA: Insufficient documentation

## 2012-04-28 MED ORDER — MECLIZINE HCL 12.5 MG PO TABS: 12.5000 mg | ORAL_TABLET | Freq: Three times a day (TID) | ORAL | Status: AC | PRN

## 2012-04-28 NOTE — Patient Instructions (Signed)
Rest, fluids, Tylenol. Antivert as needed for dizziness, may make you sleepy. Go to the ER if your symptoms are severe.

## 2012-04-28 NOTE — Progress Notes (Signed)
  Subjective:    Patient ID: Angela Valencia, female    DOB: 09/30/1946, 66 y.o.   MRN: [id number]  HPI Acute visit Yesterday, she developed vertigo for a few hours, described as the room spinning, slightly worse when she moves her head, better when she laid down in bed and stay quiet. Along with the vertigo, she had mild headache, she rarely has headache and this particular episode is not the worst of her life. She also had nausea. Her husband check her blood pressure and it was 139/82. Today, she had headache again without vertigo for about 2 hours, at this point the headache is gone.  Past Medical History  Diagnosis Date  . Urolithiasis 2007,2009  . Hypothyroidism   . Osteopenia   . Glaucoma    Past Surgical History  Procedure Date  . Gastric bypass 2005    [place]  . Cholecystectomy   . Abdominal hysterectomy 1990    no oophorectomy     Review of Systems No fever chills. No diplopia, motor deficits, slurred speech. No vomiting or diarrhea. No sinus congestion, runny nose, sneezing, itchy eyes or itchy nose.     Objective:   Physical Exam General -- alert, well-developed. No apparent distress.  Neck --FROM, normal carotid pulses HEENT -- TMs normal, throat w/o redness, face symmetric and not tender to palpation, nose not congested  Lungs -- normal respiratory effort, no intercostal retractions, no accessory muscle use, and normal breath sounds.   Heart-- normal rate, regular rhythm, no murmur, and no gallop.   Extremities-- no pretibial edema bilaterally  Neurologic-- alert & oriented X3, CNs normal, motor-DTRs symmetric, gait normal, speech fluent. Psych-- Cognition and judgment appear intact. Alert and cooperative with normal attention span and concentration.  not anxious appearing and not depressed appearing.          Assessment & Plan:   Headache, vertigo: Headache and vertigo of unclear etiology (although vertigo fits more a peripheral description) , symptoms  are now subsided. Neurological examination and review of systems is essentially negative for red flags. She is a low risk for cardiovascular events. Plan: CT of the head, if his CT is negative will recommend conservative treatment with rest, Tylenol and Antivert. Patient advised to go to the ER-- even if the CT is negative--- if she has severe symptoms. She verbalized understanding.

## 2012-05-01 ENCOUNTER — Encounter: Payer: Self-pay | Admitting: Internal Medicine

## 2012-05-12 ENCOUNTER — Telehealth: Payer: Self-pay | Admitting: Internal Medicine

## 2012-05-12 NOTE — Telephone Encounter (Signed)
dexa 02-2012 showed osteopenia w/ a T score of -1.4 (last T score -1.5 in 2010)  Please send a letter:  Your bone density is stable , I recommend daily supplements of calcium, vit D and exercise. Su densiometria osea esta estable, tiene "osteopenia". Le recomiendo tomar diariamente calcio, vitamina D y hacer ejercicio.

## 2012-05-13 ENCOUNTER — Encounter: Payer: Self-pay | Admitting: *Deleted

## 2012-05-13 NOTE — Telephone Encounter (Signed)
Mailed letter °

## 2012-09-19 ENCOUNTER — Ambulatory Visit (INDEPENDENT_AMBULATORY_CARE_PROVIDER_SITE_OTHER): Payer: BC Managed Care – PPO | Admitting: Internal Medicine

## 2012-09-19 VITALS — BP 128/74 | HR 61 | Temp 97.8°F | Wt 148.0 lb

## 2012-09-19 DIAGNOSIS — M546 Pain in thoracic spine: Secondary | ICD-10-CM

## 2012-09-19 DIAGNOSIS — M1990 Unspecified osteoarthritis, unspecified site: Secondary | ICD-10-CM | POA: Insufficient documentation

## 2012-09-19 DIAGNOSIS — M949 Disorder of cartilage, unspecified: Secondary | ICD-10-CM

## 2012-09-19 DIAGNOSIS — K222 Esophageal obstruction: Secondary | ICD-10-CM | POA: Insufficient documentation

## 2012-09-19 DIAGNOSIS — M899 Disorder of bone, unspecified: Secondary | ICD-10-CM

## 2012-09-19 NOTE — Assessment & Plan Note (Signed)
DEXA 02-2012 showed osteopenia again.  Vitamin D  was low, status post ergocalciferol 50,000 unit, currently on over-the-counter calcium and vitamin D.

## 2012-09-19 NOTE — Assessment & Plan Note (Signed)
3 weeks history of thoracic back pain without radiation Plan: X-ray Tylenol

## 2012-09-19 NOTE — Patient Instructions (Addendum)
Tylenol  500 mg OTC 2 tabs a day every 8 hours as needed for pain If the pain continue or gets worse let me know ( referral) --- Please get your x-ray at the other [facility]  office located at: [address] [place], across from [facility]  Please go to the basement, this is a walk-in facility, they are open from 8:30 to 5:30 PM. Phone number [phone].-

## 2012-09-19 NOTE — Progress Notes (Signed)
  Subjective:    Patient ID: Angela Valencia, female    DOB: [birth year], 66 y.o.   MRN: [id number]  HPI Routine office visit Was seen a few months ago with headaches, CT negative, headaches resolved. Also, she had dysphagia, saw GI, chart reviewed, see assessment and plan. Good compliance with Synthroid. Complains of back pain for 3 weeks, located in the mid thoracic area, no radiation, intense and sharp pain with certain movements, otherwise a mild ache on and off.  Past Medical History  Diagnosis Date  . Urolithiasis 2007,2009  . Hypothyroidism   . Osteopenia   . Glaucoma    Past Surgical History  Procedure Date  . Gastric bypass 2005    [place]  . Cholecystectomy   . Abdominal hysterectomy 1990    no oophorectomy     Review of Systems Denies any recent injury. No fever chills, no weight loss. No bladder or bowel incontinence. Also few months ago was diagnosed with low vitamin D, status post replacement, currently on over-the-counter vitamin D and calcium.    Objective:   Physical Exam General -- alert, well-developed, and well-nourished.    Extremities-- no pretibial edema bilaterally Musculoskeletal --not tender to palpation at the thoracic spine Neurologic-- alert & oriented X3 , DTRsand strength normal in all extremities. Posture not antalgic, gait normal. Straight leg test negative. Psych-- Cognition and judgment appear intact. Alert and cooperative with normal attention span and concentration.  not anxious appearing and not depressed appearing.       Assessment & Plan:

## 2012-09-19 NOTE — Assessment & Plan Note (Signed)
Refer to GI a few months ago d/t  dysphagia, Dr. Loreta Ave performed a EGD and dilatation. Since then, symptoms are about the same, she self discontinue PPIs, denies heartburn per se. Plan: Observation for now, should symptoms increase, needs to see GI again.

## 2012-09-20 ENCOUNTER — Encounter: Payer: Self-pay | Admitting: Internal Medicine

## 2012-09-27 ENCOUNTER — Ambulatory Visit (INDEPENDENT_AMBULATORY_CARE_PROVIDER_SITE_OTHER)
Admission: RE | Admit: 2012-09-27 | Discharge: 2012-09-27 | Disposition: A | Discharge status: Home / Self Care | Payer: BC Managed Care – PPO | Source: Ambulatory Visit | Attending: Internal Medicine | Admitting: Internal Medicine

## 2012-09-27 DIAGNOSIS — M546 Pain in thoracic spine: Secondary | ICD-10-CM

## 2012-09-30 ENCOUNTER — Encounter: Payer: Self-pay | Admitting: Internal Medicine

## 2012-09-30 ENCOUNTER — Ambulatory Visit (INDEPENDENT_AMBULATORY_CARE_PROVIDER_SITE_OTHER): Payer: BC Managed Care – PPO | Admitting: Internal Medicine

## 2012-09-30 VITALS — BP 136/72 | HR 67 | Temp 98.2°F | Wt 148.0 lb

## 2012-09-30 DIAGNOSIS — E039 Hypothyroidism, unspecified: Secondary | ICD-10-CM

## 2012-09-30 DIAGNOSIS — E559 Vitamin D deficiency, unspecified: Secondary | ICD-10-CM

## 2012-09-30 DIAGNOSIS — M546 Pain in thoracic spine: Secondary | ICD-10-CM

## 2012-09-30 HISTORY — DX: Vitamin D deficiency, unspecified: E55.9

## 2012-09-30 LAB — TSH: TSH: 1.187 u[IU]/mL (ref 0.350–4.500)

## 2012-09-30 NOTE — Progress Notes (Signed)
  Subjective:    Patient ID: Angela Valencia, female    DOB: 1946-10-02, 66 y.o.   MRN: [id number]  HPI Acute visit Was seen several days ago with her thoracic back pain, x-ray was negative. Pain is about the same, Tylenol does help a little bit. The pain is worse with physical activity.  Past medical history Kidney stones Hypothyroidism Osteopenia Glaucoma  Past surgical history Gastric bypass 2005 in [place] Cholecystectomy Abdominal hysterectomy in the 90s, no oophorectomy  Review of Systems Again she denies any bladder or bowel incontinence, fever or chills. No recent back injury. As far as her chronic medical problems, she took ergocalciferol for decreased vitamin D. Good compliance with Synthroid.     Objective:   Physical Exam  General -- alert, well-developed, and well-nourished.  Extremities-- no pretibial edema bilaterally  Musculoskeletal --not tender to palpation at the thoracic spine  Neurologic-- alert & oriented X3 , DTRsand strength normal in all extremities. Posture not antalgic, gait normal. Straight leg test negative.  Psych--   not anxious appearing and not depressed appearing.      Assessment & Plan:

## 2012-09-30 NOTE — Assessment & Plan Note (Signed)
Status post ergocalciferol, currently on over-the-counter ca and vitamin D. Check levels.

## 2012-09-30 NOTE — Assessment & Plan Note (Signed)
Pain has mechanical features, x-ray was negative, as if pain continues will refer to orthopedic surgery. ( [place]) Declined pain med Rx

## 2012-09-30 NOTE — Assessment & Plan Note (Signed)
good medication compliance Labs  

## 2012-10-05 ENCOUNTER — Encounter: Payer: Self-pay | Admitting: *Deleted

## 2012-10-05 LAB — VITAMIN D 1,25 DIHYDROXY
Vitamin D 1, 25 (OH)2 Total: 92 pg/mL — ABNORMAL HIGH (ref 18–72)
Vitamin D2 1, 25 (OH)2: 53 pg/mL
Vitamin D3 1, 25 (OH)2: 39 pg/mL

## 2012-12-01 ENCOUNTER — Other Ambulatory Visit: Payer: Self-pay | Admitting: Internal Medicine

## 2012-12-01 DIAGNOSIS — Z1231 Encounter for screening mammogram for malignant neoplasm of breast: Secondary | ICD-10-CM

## 2012-12-07 ENCOUNTER — Ambulatory Visit (HOSPITAL_BASED_OUTPATIENT_CLINIC_OR_DEPARTMENT_OTHER)
Admission: RE | Admit: 2012-12-07 | Discharge: 2012-12-07 | Disposition: A | Discharge status: Home / Self Care | Payer: BC Managed Care – PPO | Source: Ambulatory Visit | Attending: Internal Medicine | Admitting: Internal Medicine

## 2012-12-07 DIAGNOSIS — Z1231 Encounter for screening mammogram for malignant neoplasm of breast: Secondary | ICD-10-CM | POA: Insufficient documentation

## 2013-04-05 ENCOUNTER — Ambulatory Visit (INDEPENDENT_AMBULATORY_CARE_PROVIDER_SITE_OTHER): Payer: Medicare Other | Admitting: Internal Medicine

## 2013-04-05 ENCOUNTER — Encounter: Payer: Self-pay | Admitting: Internal Medicine

## 2013-04-05 VITALS — BP 126/74 | HR 60 | Temp 98.1°F | Ht 60.0 in | Wt 144.0 lb

## 2013-04-05 DIAGNOSIS — I8390 Asymptomatic varicose veins of unspecified lower extremity: Secondary | ICD-10-CM

## 2013-04-05 DIAGNOSIS — E559 Vitamin D deficiency, unspecified: Secondary | ICD-10-CM

## 2013-04-05 DIAGNOSIS — R945 Abnormal results of liver function studies: Secondary | ICD-10-CM | POA: Insufficient documentation

## 2013-04-05 DIAGNOSIS — I8393 Asymptomatic varicose veins of bilateral lower extremities: Secondary | ICD-10-CM

## 2013-04-05 DIAGNOSIS — E039 Hypothyroidism, unspecified: Secondary | ICD-10-CM

## 2013-04-05 DIAGNOSIS — M1990 Unspecified osteoarthritis, unspecified site: Secondary | ICD-10-CM

## 2013-04-05 DIAGNOSIS — Z0000 Encounter for general adult medical examination without abnormal findings: Secondary | ICD-10-CM

## 2013-04-05 DIAGNOSIS — M899 Disorder of bone, unspecified: Secondary | ICD-10-CM

## 2013-04-05 DIAGNOSIS — Z23 Encounter for immunization: Secondary | ICD-10-CM

## 2013-04-05 DIAGNOSIS — R7989 Other specified abnormal findings of blood chemistry: Secondary | ICD-10-CM | POA: Insufficient documentation

## 2013-04-05 LAB — HEPATIC FUNCTION PANEL
AST: 22 U/L (ref 0–37)
Albumin: 3.8 g/dL (ref 3.5–5.2)

## 2013-04-05 LAB — TSH: TSH: 3.41 u[IU]/mL (ref 0.35–5.50)

## 2013-04-05 MED ORDER — LEVOTHYROXINE SODIUM 50 MCG PO TABS: 50.0000 ug | ORAL_TABLET | Freq: Every day | ORAL | Status: DC

## 2013-04-05 MED ORDER — MELOXICAM 7.5 MG PO TABS: 7.5000 mg | ORAL_TABLET | Freq: Every day | ORAL | Status: DC

## 2013-04-05 NOTE — Assessment & Plan Note (Signed)
asx

## 2013-04-05 NOTE — Assessment & Plan Note (Signed)
RF meds and labs

## 2013-04-05 NOTE — Assessment & Plan Note (Signed)
rec Ca and Vit D H/o low vit D Plan Labs, DEXA

## 2013-04-05 NOTE — Assessment & Plan Note (Signed)
AP elevated before, re check

## 2013-04-05 NOTE — Assessment & Plan Note (Addendum)
Td 2007 Shingles inmunization 2012 Pneumonia shot today  Cscope 2005, no polyps per patient ([place])  , again had a Cscope w/ Dr Loreta Ave 12-2010, tics, no polyps, next in 10 years per report  last  MMG neg 11-2012 s/p hyst: no h/o cancer or abnormal PAP, saw  DrGreene 11-2012 diet-exercise discussed  labs from 3-13 reviewed: normal renal fx, no anemia, normal cholesterol

## 2013-04-05 NOTE — Assessment & Plan Note (Signed)
Saw ortho, dx w/  DJD, s/p PT, on meloxicam, helps, likes a RF RF done, GI s/e discussed

## 2013-04-05 NOTE — Progress Notes (Signed)
  Subjective:    Patient ID: Angela Valencia, female    DOB: [birth year], 67 y.o.   MRN: [id number]  HPI Here for Medicare AWV:  1. Risk factors based on Past M, S, F history: reviewed 2. Physical Activities: plans to join a gym  3. Depression/mood:  (-) screening  4. Hearing:  No problemss noted or reported  5. ADL's:  Completely independent 6. Fall Risk: low, see instructions  7. home Safety: does feel safe at home  8. Height, weight, &visual acuity: see VS, sees eye doctor 9. Counseling: provided 10. Labs ordered based on risk factors: if needed  11. Referral Coordination: if needed 12.  Care Plan, see assessment and plan  13.   Cognitive Assessment: motor skills and cognition wnl  In addition, today we discussed the following: Thyroid-- good med compliance except x 3 days Neck pain-- saw ortho, dx w/ DJD, meloxicam helped, did PT. Likes a RF meloxicam, denies GI s/e Varicose veins-- denies pain-swelling    Past Medical History  Diagnosis Date  . Urolithiasis 2007,2009  . Hypothyroidism   . Osteopenia   . Glaucoma(365)    Past Surgical History  Procedure Laterality Date  . Gastric bypass  2005    [place]  . Cholecystectomy    . Abdominal hysterectomy  1990    no oophorectomy   History   Social History  . Marital Status: Married    Spouse Name: N/A    Number of Children: 2  . Years of Education: N/A   Occupational History  . 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     Social History Main Topics  . Smoking status: Never Smoker   . Smokeless tobacco: Never Used  . Alcohol Use: Yes     Comment: rarely  . Drug Use: No  . Sexually Active: Not on file   Other Topics Concern  . Not on file   Social History Narrative   From [place], married to [place] x 39 years, husband has dementia, improved lately.   Diet-- trying to eat healthy   Exercise-- plans to star a gym program   Family History  Problem Relation Age of Onset  . Stroke Father   . Coronary artery  disease Neg Hx   . Diabetes Neg Hx   . Cancer Neg Hx     colon,prostate, breast  . Stomach cancer Other     uncle  . Diabetes Neg Hx      Review of Systems  Respiratory: Negative for cough and shortness of breath.   Cardiovascular: Negative for chest pain and leg swelling.  Gastrointestinal: Negative for abdominal pain and blood in stool.  Genitourinary: Negative for dysuria, hematuria, vaginal bleeding and difficulty urinating.       Objective:   Physical Exam  General -- alert, well-developed, NAD  Neck --no thyromegaly , normal carotid pulse  Lungs -- normal respiratory effort, no intercostal retractions, no accessory muscle use, and normal breath sounds.   Heart-- normal rate, regular rhythm, no murmur, and no gallop.   Abdomen--soft, non-tender, no distention, no masses, no HSM, no guarding, and no rigidity.   Extremities-- no pretibial edema bilaterally; some varicose veins w/o evidence of phlebitis  Neurologic-- alert & oriented X3 and strength normal in all extremities. Psych-- Cognition and judgment appear intact. Alert and cooperative with normal attention span and concentration.  not anxious appearing and not depressed appearing.       Assessment & Plan:

## 2013-04-05 NOTE — Patient Instructions (Signed)
Take meloxicam as needed for pain. Always take it with food. Watch for stomach side effects (gastritis): nausea, stomach pain, change in the color of stools.  Fall Prevention and Home Safety Falls cause injuries and can affect all age groups. It is possible to use preventive measures to significantly decrease the likelihood of falls. There are many simple measures which can make your home safer and prevent falls. OUTDOORS  Repair cracks and edges of walkways and driveways.  Remove high doorway thresholds.  Trim shrubbery on the main path into your home.  Have good outside lighting.  Clear walkways of tools, rocks, debris, and clutter.  Check that handrails are not broken and are securely fastened. Both sides of steps should have handrails.  Have leaves, snow, and ice cleared regularly.  Use sand or salt on walkways during winter months.  In the garage, clean up grease or oil spills. BATHROOM  Install night lights.  Install grab bars by the toilet and in the tub and shower.  Use non-skid mats or decals in the tub or shower.  Place a plastic non-slip stool in the shower to sit on, if needed.  Keep floors dry and clean up all water on the floor immediately.  Remove soap buildup in the tub or shower on a regular basis.  Secure bath mats with non-slip, double-sided rug tape.  Remove throw rugs and tripping hazards from the floors. BEDROOMS  Install night lights.  Make sure a bedside light is easy to reach.  Do not use oversized bedding.  Keep a telephone by your bedside.  Have a firm chair with side arms to use for getting dressed.  Remove throw rugs and tripping hazards from the floor. KITCHEN  Keep handles on pots and pans turned toward the center of the stove. Use back burners when possible.  Clean up spills quickly and allow time for drying.  Avoid walking on wet floors.  Avoid hot utensils and knives.  Position shelves so they are not too high or  low.  Place commonly used objects within easy reach.  If necessary, use a sturdy step stool with a grab bar when reaching.  Keep electrical cables out of the way.  Do not use floor polish or wax that makes floors slippery. If you must use wax, use non-skid floor wax.  Remove throw rugs and tripping hazards from the floor. STAIRWAYS  Never leave objects on stairs.  Place handrails on both sides of stairways and use them. Fix any loose handrails. Make sure handrails on both sides of the stairways are as long as the stairs.  Check carpeting to make sure it is firmly attached along stairs. Make repairs to worn or loose carpet promptly.  Avoid placing throw rugs at the top or bottom of stairways, or properly secure the rug with carpet tape to prevent slippage. Get rid of throw rugs, if possible.  Have an electrician put in a light switch at the top and bottom of the stairs. OTHER FALL PREVENTION TIPS  Wear low-heel or rubber-soled shoes that are supportive and fit well. Wear closed toe shoes.  When using a stepladder, make sure it is fully opened and both spreaders are firmly locked. Do not climb a closed stepladder.  Add color or contrast paint or tape to grab bars and handrails in your home. Place contrasting color strips on first and last steps.  Learn and use mobility aids as needed. Install an electrical emergency response system.  Turn on lights to avoid  dark areas. Replace light bulbs that burn out immediately. Get light switches that glow.  Arrange furniture to create clear pathways. Keep furniture in the same place.  Firmly attach carpet with non-skid or double-sided tape.  Eliminate uneven floor surfaces.  Select a carpet pattern that does not visually hide the edge of steps.  Be aware of all pets. OTHER HOME SAFETY TIPS  Set the water temperature for 120 F (48.8 C).  Keep emergency numbers on or near the telephone.  Keep smoke detectors on every level of the  home and near sleeping areas. Document Released: 12/04/2002 Document Revised: 06/14/2012 Document Reviewed: 03/04/2012 [facility] Patient Information 2013 [place], [place].

## 2013-04-10 ENCOUNTER — Encounter: Payer: Self-pay | Admitting: *Deleted

## 2013-04-10 LAB — VITAMIN D 1,25 DIHYDROXY
Vitamin D 1, 25 (OH)2 Total: 78 pg/mL — ABNORMAL HIGH (ref 18–72)
Vitamin D3 1, 25 (OH)2: 46 pg/mL

## 2013-04-13 IMAGING — CR DG THORACIC SPINE 3V
3 series · 3 of 3 positions shown · non-contrast
Comparison: Chest CTA 12/27/2007.

CLINICAL DATA: 65-year-old female with back pain.

THORACIC SPINE - 2 VIEW + SWIMMERS

[view not recorded (1 of 3)]
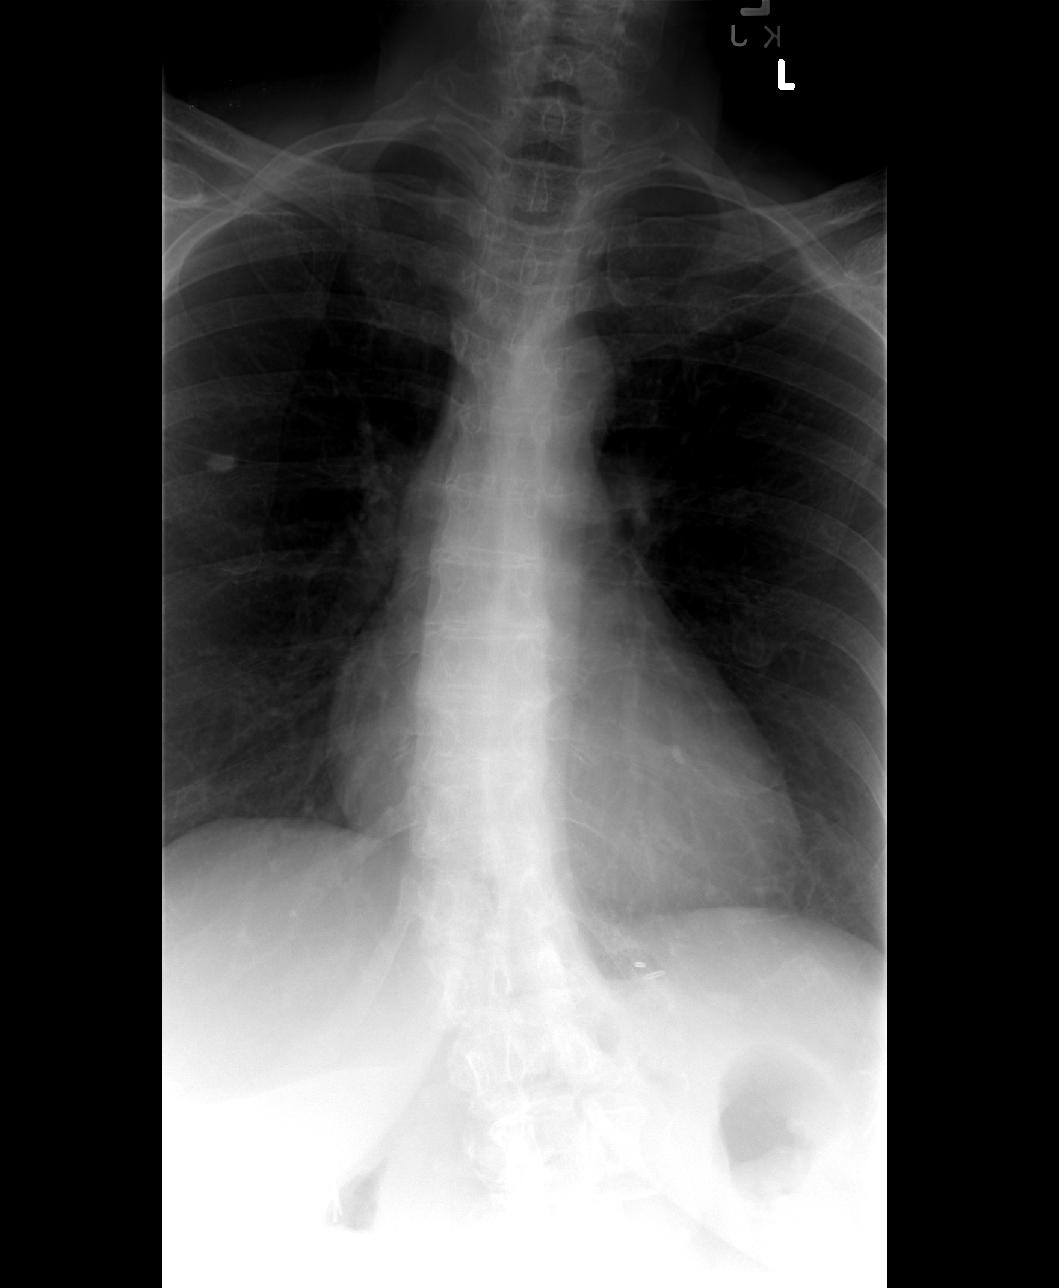

[view not recorded (2 of 3)]
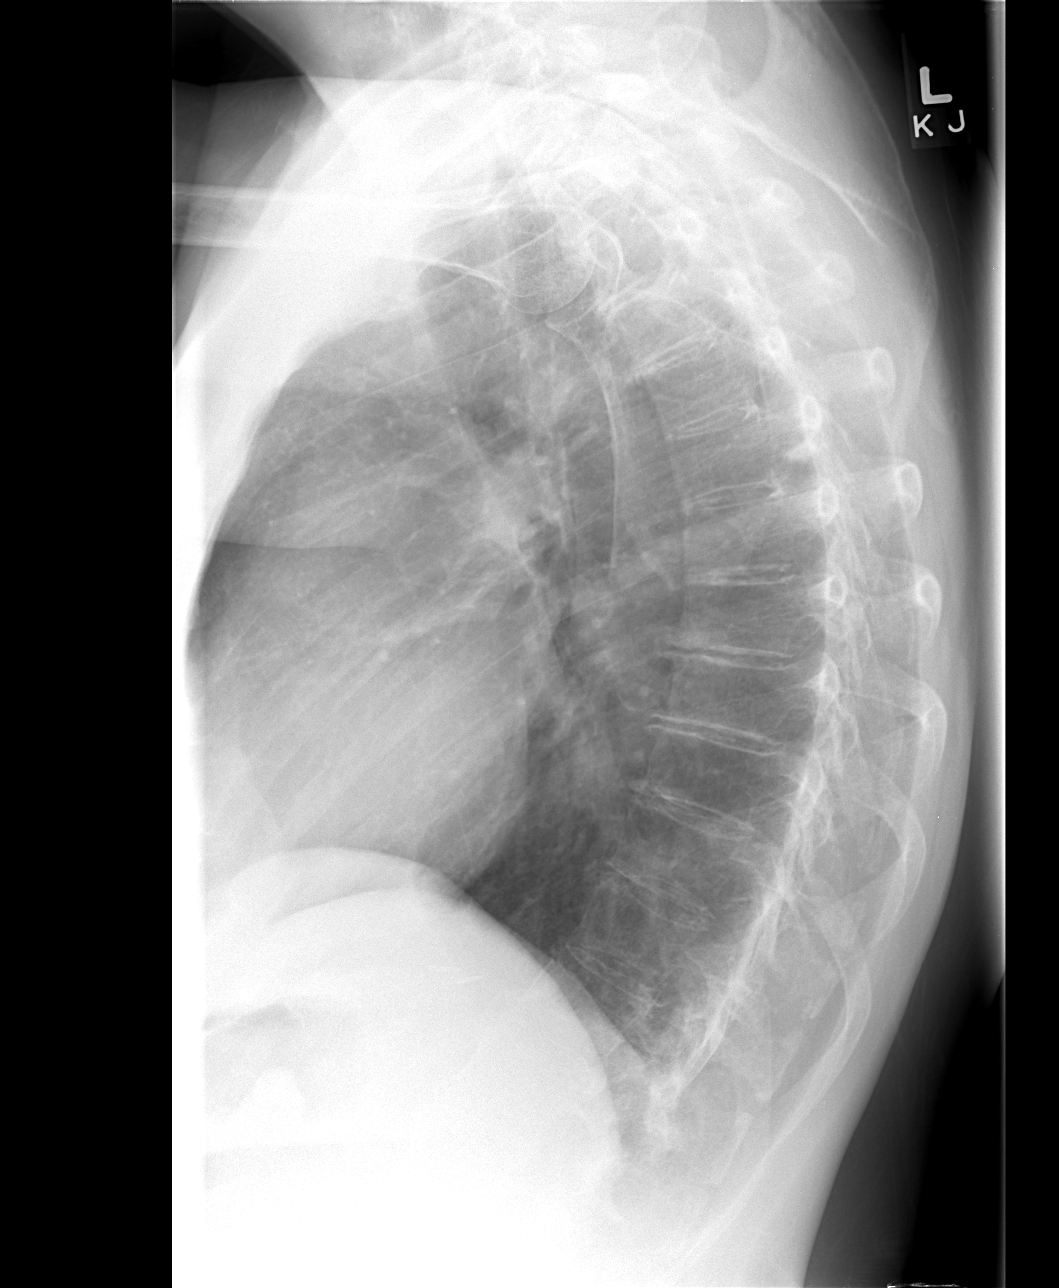

[view not recorded (3 of 3)]
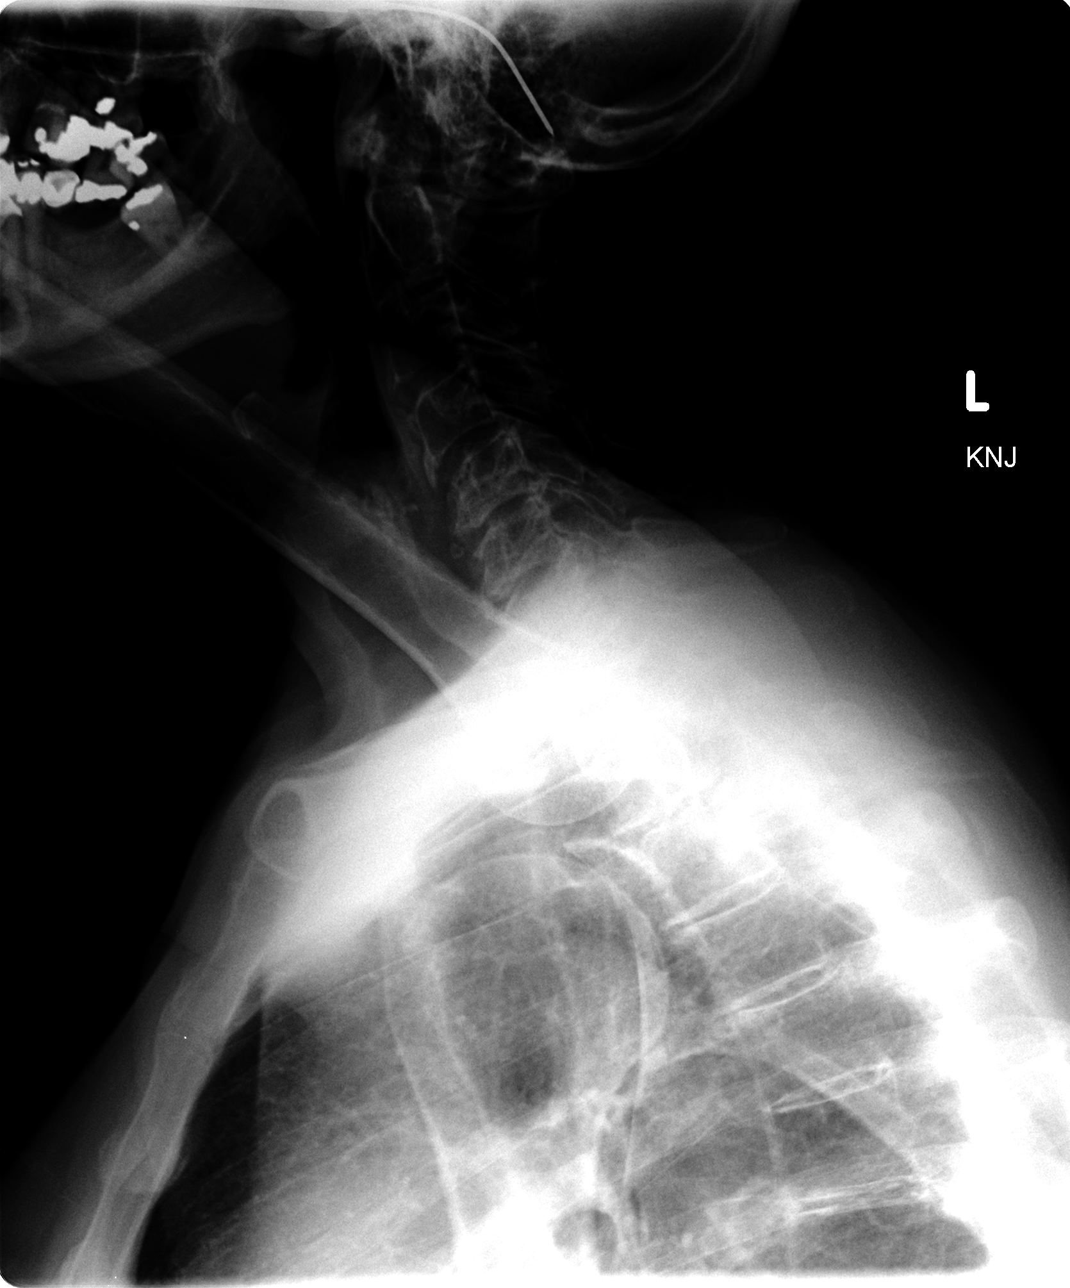

[3 of 3 positions shown; findings below may reference images not displayed]

FINDINGS: Chronic dextroconvex scoliosis.  Thoracic segmentation
within normal limits.  Osteopenia.  Stable thoracic vertebral
height and alignment.  Chronic mid thoracic endplate spurring.
Stable epigastric and right upper quadrant surgical clips.  Stable
right lung calcified granuloma. Cervicothoracic junction alignment
is within normal limits.   Lower cervical disc and endplate
degeneration.
IMPRESSION: No acute osseous abnormality in the thoracic spine.

## 2013-04-19 ENCOUNTER — Other Ambulatory Visit: Payer: Medicare Other

## 2013-05-03 ENCOUNTER — Other Ambulatory Visit: Payer: Medicare Other

## 2013-05-08 ENCOUNTER — Ambulatory Visit
Admission: RE | Admit: 2013-05-08 | Discharge: 2013-05-08 | Disposition: A | Discharge status: Home / Self Care | Payer: Medicare Other | Source: Ambulatory Visit | Attending: Internal Medicine | Admitting: Internal Medicine

## 2013-05-08 DIAGNOSIS — M949 Disorder of cartilage, unspecified: Secondary | ICD-10-CM

## 2013-10-06 ENCOUNTER — Encounter: Payer: Self-pay | Admitting: Internal Medicine

## 2013-10-06 ENCOUNTER — Ambulatory Visit (INDEPENDENT_AMBULATORY_CARE_PROVIDER_SITE_OTHER): Payer: Medicare HMO | Admitting: Internal Medicine

## 2013-10-06 VITALS — BP 124/77 | HR 68 | Temp 97.7°F | Wt 134.4 lb

## 2013-10-06 DIAGNOSIS — K222 Esophageal obstruction: Secondary | ICD-10-CM

## 2013-10-06 DIAGNOSIS — M899 Disorder of bone, unspecified: Secondary | ICD-10-CM

## 2013-10-06 DIAGNOSIS — E039 Hypothyroidism, unspecified: Secondary | ICD-10-CM

## 2013-10-06 DIAGNOSIS — G4700 Insomnia, unspecified: Secondary | ICD-10-CM

## 2013-10-06 LAB — TSH: TSH: 2.54 u[IU]/mL (ref 0.35–5.50)

## 2013-10-06 MED ORDER — ZOLPIDEM TARTRATE 5 MG PO TABS: 5.0000 mg | ORAL_TABLET | Freq: Every evening | ORAL | Status: DC | PRN

## 2013-10-06 NOTE — Progress Notes (Signed)
  Subjective:    Patient ID: Angela Valencia, female    DOB: July 26, 1946, 67 y.o.   MRN: [id number]  HPI Routine office visit Hypothyroidism, good medication compliance History of dysphagia, had a EGD a few months ago while visiting [place], see assessment and plan Insomnia, several months history of difficulty falling asleep, progressively getting worse, thinks is related to her worries about her husband's health.  Past Medical History  Diagnosis Date  . Urolithiasis 2007,2009  . Hypothyroidism   . Osteopenia   . Glaucoma    Past Surgical History  Procedure Laterality Date  . Gastric bypass  2005    [place]  . Cholecystectomy    . Abdominal hysterectomy  1990    no oophorectomy   History   Social History  . Marital Status: Married    Spouse Name: N/A    Number of Children: 2  . Years of Education: N/A   Occupational History  . 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     Social History Main Topics  . Smoking status: Never Smoker   . Smokeless tobacco: Never Used  . Alcohol Use: No     Comment: rarely  . Drug Use: No  . Sexual Activity: Not on file   Other Topics Concern  . Not on file   Social History Narrative   From [place], married to Angela Valencia x 39 years, husband has dementia, improved lately.          Review of Systems After the EGD she had no further dysphagia. Denies nausea, vomiting, diarrhea or blood in the stools.No heartburn per se. Denies depression. admits to weight loss, thinks related to better diet and more exercise.     Objective:   Physical Exam BP 124/77  Pulse 68  Temp(Src) 97.7 F (36.5 C)  Wt 134 lb 6.4 oz (60.963 kg)  BMI 26.25 kg/m2  SpO2 97% General -- alert, well-developed, NAD.   Lungs -- normal respiratory effort, no intercostal retractions, no accessory muscle use, and normal breath sounds.  Heart-- normal rate, regular rhythm, no murmur.  Neurologic--  alert & oriented X3. Speech normal, gait normal, strength normal in all  extremities.  Psych-- Cognition and judgment appear intact. Cooperative with normal attention span and concentration. No anxious appearing , no depressed appearing.     Assessment & Plan:

## 2013-10-06 NOTE — Patient Instructions (Addendum)
Get your blood work before you leave  Next visit in 4 months for a check up

## 2013-10-06 NOTE — Assessment & Plan Note (Signed)
New problem. Patient is worried about her husband's health, no depression, during the day does not feel anxious. Counseled about her sleep habits, trial with Ambien 5 mg, if she is not responding well, we'll try a different med, xanax?

## 2013-10-06 NOTE — Assessment & Plan Note (Signed)
Good medication compliance, labs 

## 2013-10-06 NOTE — Assessment & Plan Note (Signed)
Bone density test was not performed

## 2013-10-06 NOTE — Assessment & Plan Note (Addendum)
The patient brought a report from a EGD performed 05/22/2013 while she was in [place]: Postsurgical changes were observed, had a  Dilatation (at the [facility]?) Feels better since the procedure Report will be scan

## 2013-10-09 ENCOUNTER — Encounter: Payer: Self-pay | Admitting: *Deleted

## 2013-10-09 NOTE — Progress Notes (Signed)
Letter mailed to patient.

## 2013-12-05 ENCOUNTER — Telehealth: Payer: Self-pay | Admitting: *Deleted

## 2013-12-05 MED ORDER — LEVOTHYROXINE SODIUM 50 MCG PO TABS: 50.0000 ug | ORAL_TABLET | Freq: Every day | ORAL | Status: DC

## 2013-12-05 NOTE — Telephone Encounter (Signed)
2wk samples given. rx refilled per protocol. DJR

## 2013-12-05 NOTE — Telephone Encounter (Signed)
12/05/2013  Pt came by office, said she came in last week and requested refill on levothyroxine (SYNTHROID, LEVOTHROID) 50 MCG tablet, to be sent to [facility].  Pt stated [facility] faxed a request and needs approval for this, and has not received from our office.  Patient has 3 pills left.  Thank you.  bw

## 2014-02-07 ENCOUNTER — Ambulatory Visit (INDEPENDENT_AMBULATORY_CARE_PROVIDER_SITE_OTHER): Payer: Medicare HMO | Admitting: Internal Medicine

## 2014-02-07 ENCOUNTER — Encounter: Payer: Self-pay | Admitting: Internal Medicine

## 2014-02-07 VITALS — BP 120/73 | HR 60 | Temp 98.3°F | Wt 133.0 lb

## 2014-02-07 DIAGNOSIS — M1990 Unspecified osteoarthritis, unspecified site: Secondary | ICD-10-CM

## 2014-02-07 DIAGNOSIS — G4700 Insomnia, unspecified: Secondary | ICD-10-CM

## 2014-02-07 DIAGNOSIS — E039 Hypothyroidism, unspecified: Secondary | ICD-10-CM

## 2014-02-07 MED ORDER — CYCLOBENZAPRINE HCL 10 MG PO TABS: 5.0000 mg | ORAL_TABLET | Freq: Every evening | ORAL | Status: DC | PRN

## 2014-02-07 NOTE — Progress Notes (Signed)
Pre visit review using our clinic review tool, if applicable. No additional management support is needed unless otherwise documented below in the visit note. 

## 2014-02-07 NOTE — Progress Notes (Signed)
   Subjective:    Patient ID: Angela NewportRosario Vo, female    DOB: 04/15/1946, 68 y.o.   MRN: [id number]  DOS:  02/07/2014 ROV   Insomnia, his insurance won't cover Ambien, melatonin not helping.Needs an alternative. She has left over Flexeril which worked very well to help her sleep. Hypothyroidism, good medication compliance.   Past Medical History  Diagnosis Date  . Urolithiasis 2007,2009  . Hypothyroidism   . Osteopenia   . Glaucoma     Past Surgical History  Procedure Laterality Date  . Gastric bypass  2005    [place]  . Cholecystectomy    . Abdominal hysterectomy  1990    no oophorectomy    History   Social History  . Marital Status: Married    Spouse Name: N/A    Number of Children: 2  . Years of Education: N/A   Occupational History  . 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     Social History Main Topics  . Smoking status: Never Smoker   . Smokeless tobacco: Never Used  . Alcohol Use: No     Comment: rarely  . Drug Use: No  . Sexual Activity: Not on file   Other Topics Concern  . Not on file   Social History Narrative   From [place], married to [place] x 39 years, husband has dementia, improved lately.          ROS  No chest pain or difficulty breathing No nausea, vomiting, diarrhea No depression, occasional anxiety. Occasionally feels flushed in the face and gets slightly dizzy, symptoms last a minute or 2. No associated diplopia or motor deficits.     Objective:   Physical Exam There were no vitals taken for this visit. General -- alert, well-developed, NAD.   Lungs -- normal respiratory effort, no intercostal retractions, no accessory muscle use, and normal breath sounds.  Heart-- normal rate, regular rhythm, no murmur.   Neurologic--  alert & oriented X3. Speech normal, gait normal, strength normal in all extremities.  EOMI, PERLA   Psych-- Cognition and judgment appear intact. Cooperative with normal attention span and concentration. No anxious  or depressed appearing.       Assessment & Plan:  -- Occasionally has flushing feeling in the face, neuro exam normal, recommend observation --Visit took place while  the computers were down, documentation was finished after the patient was gone --- next visit 3 months , CPX

## 2014-02-07 NOTE — Assessment & Plan Note (Signed)
Compliance with medication, last TSH normal, recheck on return to the office

## 2014-02-07 NOTE — Assessment & Plan Note (Signed)
Patient's insurance won't cover Ambien, she takes occasional Flexeril for her neck pain.  Partially the reason for insomnia is neck pain consequently I recommend Flexeril 10 mg half tablet at night as needed for pain or insomnia.

## 2014-02-07 NOTE — Assessment & Plan Note (Signed)
Will prescribe Flexeril as needed at night

## 2014-02-08 ENCOUNTER — Other Ambulatory Visit: Payer: Self-pay | Admitting: Internal Medicine

## 2014-02-08 DIAGNOSIS — Z1231 Encounter for screening mammogram for malignant neoplasm of breast: Secondary | ICD-10-CM

## 2014-02-12 ENCOUNTER — Ambulatory Visit (HOSPITAL_BASED_OUTPATIENT_CLINIC_OR_DEPARTMENT_OTHER)
Admission: RE | Admit: 2014-02-12 | Discharge: 2014-02-12 | Disposition: A | Discharge status: Home / Self Care | Payer: Medicare HMO | Source: Ambulatory Visit | Attending: Internal Medicine | Admitting: Internal Medicine

## 2014-02-12 DIAGNOSIS — Z1231 Encounter for screening mammogram for malignant neoplasm of breast: Secondary | ICD-10-CM | POA: Insufficient documentation

## 2014-02-28 ENCOUNTER — Telehealth: Payer: Self-pay | Admitting: *Deleted

## 2014-02-28 NOTE — Telephone Encounter (Signed)
Prior authorization paperwork for Cyclobenzaprine faxed to [employer]. Awaiting response. JG//CMA

## 2014-02-28 NOTE — Telephone Encounter (Signed)
PA for Cyclobenzaprine approved until 12/27/2014. Approval letter sent to be scanned. JG//CMA

## 2014-05-14 ENCOUNTER — Ambulatory Visit (INDEPENDENT_AMBULATORY_CARE_PROVIDER_SITE_OTHER): Payer: Commercial Managed Care - HMO | Admitting: Internal Medicine

## 2014-05-14 ENCOUNTER — Encounter: Payer: Self-pay | Admitting: Internal Medicine

## 2014-05-14 VITALS — BP 117/69 | HR 63 | Temp 98.3°F | Ht 61.0 in | Wt 132.0 lb

## 2014-05-14 DIAGNOSIS — H409 Unspecified glaucoma: Secondary | ICD-10-CM

## 2014-05-14 DIAGNOSIS — M899 Disorder of bone, unspecified: Secondary | ICD-10-CM

## 2014-05-14 DIAGNOSIS — Z0000 Encounter for general adult medical examination without abnormal findings: Secondary | ICD-10-CM

## 2014-05-14 DIAGNOSIS — E039 Hypothyroidism, unspecified: Secondary | ICD-10-CM

## 2014-05-14 DIAGNOSIS — N959 Unspecified menopausal and perimenopausal disorder: Secondary | ICD-10-CM

## 2014-05-14 DIAGNOSIS — E559 Vitamin D deficiency, unspecified: Secondary | ICD-10-CM

## 2014-05-14 DIAGNOSIS — M949 Disorder of cartilage, unspecified: Secondary | ICD-10-CM

## 2014-05-14 DIAGNOSIS — Z9884 Bariatric surgery status: Secondary | ICD-10-CM

## 2014-05-14 LAB — CBC WITH DIFFERENTIAL/PLATELET
BASOS PCT: 1 % (ref 0.0–3.0)
Basophils Absolute: 0.1 10*3/uL (ref 0.0–0.1)
EOS PCT: 8.5 % — AB (ref 0.0–5.0)
Eosinophils Absolute: 0.5 10*3/uL (ref 0.0–0.7)
HCT: 39.3 % (ref 36.0–46.0)
Hemoglobin: 12.9 g/dL (ref 12.0–15.0)
LYMPHS PCT: 46 % (ref 12.0–46.0)
Lymphs Abs: 2.8 10*3/uL (ref 0.7–4.0)
MCHC: 32.9 g/dL (ref 30.0–36.0)
MCV: 91.6 fl (ref 78.0–100.0)
MONO ABS: 0.2 10*3/uL (ref 0.1–1.0)
Monocytes Relative: 3.8 % (ref 3.0–12.0)
NEUTROS PCT: 40.7 % — AB (ref 43.0–77.0)
Neutro Abs: 2.4 10*3/uL (ref 1.4–7.7)
Platelets: 260 10*3/uL (ref 150.0–400.0)
RBC: 4.29 Mil/uL (ref 3.87–5.11)
RDW: 14.2 % (ref 11.5–15.5)
WBC: 6 10*3/uL (ref 4.0–10.5)

## 2014-05-14 LAB — COMPREHENSIVE METABOLIC PANEL
ALBUMIN: 4.2 g/dL (ref 3.5–5.2)
ALK PHOS: 99 U/L (ref 39–117)
ALT: 15 U/L (ref 0–35)
AST: 25 U/L (ref 0–37)
BUN: 23 mg/dL (ref 6–23)
CO2: 28 mEq/L (ref 19–32)
CREATININE: 0.6 mg/dL (ref 0.4–1.2)
Calcium: 9.2 mg/dL (ref 8.4–10.5)
Chloride: 107 mEq/L (ref 96–112)
GFR: 108.22 mL/min (ref >60.00)
GLUCOSE: 74 mg/dL (ref 70–99)
Potassium: 4.2 mEq/L (ref 3.5–5.1)
Sodium: 142 mEq/L (ref 135–145)
Total Bilirubin: 0.8 mg/dL (ref 0.2–1.2)
Total Protein: 6.8 g/dL (ref 6.0–8.3)

## 2014-05-14 LAB — LIPID PANEL
Cholesterol: 148 mg/dL (ref 0–200)
HDL: 61.4 mg/dL (ref >39.00)
LDL CALC: 75 mg/dL (ref 0–99)
Total CHOL/HDL Ratio: 2
Triglycerides: 56 mg/dL (ref 0.0–149.0)
VLDL: 11.2 mg/dL (ref 0.0–40.0)

## 2014-05-14 LAB — TSH: TSH: 1.37 u[IU]/mL (ref 0.35–4.50)

## 2014-05-14 NOTE — Assessment & Plan Note (Addendum)
Dexa 12-2006 and 05-2009: osteopenia w/ a  T score of -0.9 and -1.5  Ordering a DEXA

## 2014-05-14 NOTE — Assessment & Plan Note (Addendum)
Td 2007 Shingles inmunization 2012 Pneumonia shot 2014 prevnar today  Cscope 2005, no polyps per patient ([place])  , again had a Cscope w/ Dr Loreta AveMann 12-2010, tics, no polyps, next in 10 years per report  last  MMG neg 01-2014  (-) s/p hyst: no h/o cancer or abnormal PAP, saw  Dr Neva SeatGreene [phone] [id number] diet-- discussed  Doing great w/ exercise

## 2014-05-14 NOTE — Progress Notes (Signed)
Subjective:    Patient ID: Angela Valencia, female    DOB: 03/20/1946, 68 y.o.   MRN: [id number]  DOS:  05/14/2014 Type of  visit:   Here for Medicare AWV:  1. Risk factors based on Past M, S, F history: reviewed 2. Physical Activities: goes to the gym  almost qd  3. Depression/mood:  (-) screening  but some anxiety 4. Hearing:  No problemss noted or reported   5. ADL's:  Completely independent, drives  6. Fall Risk: low, see instructions   7. home Safety: does feel safe at home   8. Height, weight, &visual acuity: see VS, sees eye doctor 9. Counseling: provided 10. Labs ordered based on risk factors: if needed   11. Referral Coordination: if needed 12.  Care Plan, see assessment and plan   13.   Cognitive Assessment: motor skills and cognition wnl  In addition, today we discussed the following: Hypothyroid-- good med compliance anxiety-- husband has dememntia, stressed about it , counseled Osteopenia-- On ca qd but not on vit D  ROS No  CP, SOB No palpitations  Denies  nausea, vomiting diarrhea Denies  blood in the stools  (-) cough, sputum production (-) wheezing, chest congestion  No dysuria, gross hematuria, difficulty urinating  Denies dizziness    Past Medical History  Diagnosis Date  . Urolithiasis 2007,2009  . Hypothyroidism   . Osteopenia   . Glaucoma     Past Surgical History  Procedure Laterality Date  . [place]  [place]  . Cholecystectomy    . Abdominal hysterectomy  1990    no oophorectomy    History   Social History  . Marital Status: Married    Spouse Name: N/A    Number of Children: 2  . Years of Education: N/A   Occupational History  . 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     Social History Main Topics  . Smoking status: Never Smoker   . Smokeless tobacco: Never Used  . Alcohol Use: Yes     Comment: rarely  . Drug Use: No  . Sexual Activity: Not on file   Other Topics Concern  . Not on file   Social History  Narrative   From [place], married to Judith Gapesar x 39 years, husband has dementia, improved lately.           Family History  Problem Relation Age of Onset  . Stroke Father   . Coronary artery disease Neg Hx   . Cancer Neg Hx     colon, breast  . Stomach cancer Other     uncle  . Diabetes Neg Hx        Medication List       This list is accurate as of: 05/14/14 11:59 PM.  Always use your most recent med list.               CALCIUM + D PO  Take by mouth.     cyclobenzaprine 10 MG tablet  Commonly known as:  FLEXERIL  Take 0.5-1 tablets (5-10 mg total) by mouth at bedtime as needed for muscle spasms (neck pain).     levothyroxine 50 MCG tablet  Commonly known as:  SYNTHROID, LEVOTHROID  Take 1 tablet (50 mcg total) by mouth daily.     meloxicam 7.5 MG tablet  Commonly known as:  MOBIC  Take 7.5 mg by mouth daily as needed for pain.     vitamin B-12  100 MCG tablet  Commonly known as:  CYANOCOBALAMIN  Take 1 tablet (100 mcg total) by mouth daily.           Objective:   Physical Exam BP 117/69  Pulse 63  Temp(Src) 98.3 F (36.8 C) (Oral)  Ht 5\' 1"  (1.549 m)  Wt 132 lb (59.875 kg)  BMI 24.95 kg/m2  SpO2 97% General -- alert, well-developed, NAD.  Neck --no thyromegaly  HEENT-- Not pale. Lungs -- normal respiratory effort, no intercostal retractions, no accessory muscle use, and normal breath sounds.  Heart-- normal rate, regular rhythm, no murmur.  Abdomen-- Not distended, good bowel sounds,soft, non-tender. Extremities-- no pretibial edema bilaterally  Neurologic--  alert & oriented X3. Speech normal, gait normal, strength normal in all extremities.  Psych-- Cognition and judgment appear intact. Cooperative with normal attention span and concentration. No anxious or depressed appearing.       Assessment & Plan:

## 2014-05-14 NOTE — Patient Instructions (Signed)
Get your blood work before you leave     Next visit is for routine check up in 6 months  No need to come back fasting Please make an appointment     Fall Prevention and Home Safety Falls cause injuries and can affect all age groups. It is possible to use preventive measures to significantly decrease the likelihood of falls. There are many simple measures which can make your home safer and prevent falls. OUTDOORS  Repair cracks and edges of walkways and driveways.  Remove high doorway thresholds.  Trim shrubbery on the main path into your home.  Have good outside lighting.  Clear walkways of tools, rocks, debris, and clutter.  Check that handrails are not broken and are securely fastened. Both sides of steps should have handrails.  Have leaves, snow, and ice cleared regularly.  Use sand or salt on walkways during winter months.  In the garage, clean up grease or oil spills. BATHROOM  Install night lights.  Install grab bars by the toilet and in the tub and shower.  Use non-skid mats or decals in the tub or shower.  Place a plastic non-slip stool in the shower to sit on, if needed.  Keep floors dry and clean up all water on the floor immediately.  Remove soap buildup in the tub or shower on a regular basis.  Secure bath mats with non-slip, double-sided rug tape.  Remove throw rugs and tripping hazards from the floors. BEDROOMS  Install night lights.  Make sure a bedside light is easy to reach.  Do not use oversized bedding.  Keep a telephone by your bedside.  Have a firm chair with side arms to use for getting dressed.  Remove throw rugs and tripping hazards from the floor. KITCHEN  Keep handles on pots and pans turned toward the center of the stove. Use back burners when possible.  Clean up spills quickly and allow time for drying.  Avoid walking on wet floors.  Avoid hot utensils and knives.  Position shelves so they are not too high or  low.  Place commonly used objects within easy reach.  If necessary, use a sturdy step stool with a grab bar when reaching.  Keep electrical cables out of the way.  Do not use floor polish or wax that makes floors slippery. If you must use wax, use non-skid floor wax.  Remove throw rugs and tripping hazards from the floor. STAIRWAYS  Never leave objects on stairs.  Place handrails on both sides of stairways and use them. Fix any loose handrails. Make sure handrails on both sides of the stairways are as long as the stairs.  Check carpeting to make sure it is firmly attached along stairs. Make repairs to worn or loose carpet promptly.  Avoid placing throw rugs at the top or bottom of stairways, or properly secure the rug with carpet tape to prevent slippage. Get rid of throw rugs, if possible.  Have an electrician put in a light switch at the top and bottom of the stairs. OTHER FALL PREVENTION TIPS  Wear low-heel or rubber-soled shoes that are supportive and fit well. Wear closed toe shoes.  When using a stepladder, make sure it is fully opened and both spreaders are firmly locked. Do not climb a closed stepladder.  Add color or contrast paint or tape to grab bars and handrails in your home. Place contrasting color strips on first and last steps.  Learn and use mobility aids as needed. Install an electrical   emergency response system.  Turn on lights to avoid dark areas. Replace light bulbs that burn out immediately. Get light switches that glow.  Arrange furniture to create clear pathways. Keep furniture in the same place.  Firmly attach carpet with non-skid or double-sided tape.  Eliminate uneven floor surfaces.  Select a carpet pattern that does not visually hide the edge of steps.  Be aware of all pets. OTHER HOME SAFETY TIPS  Set the water temperature for 120 F (48.8 C).  Keep emergency numbers on or near the telephone.  Keep smoke detectors on every level of the  home and near sleeping areas. Document Released: 12/04/2002 Document Revised: 06/14/2012 Document Reviewed: 03/04/2012 ExitCare Patient Information 2014 [employer].  

## 2014-05-14 NOTE — Assessment & Plan Note (Signed)
Has a excellent lifestyle, exercising daily. Labs

## 2014-05-14 NOTE — Assessment & Plan Note (Signed)
Good med compliance , labs  

## 2014-05-14 NOTE — Assessment & Plan Note (Addendum)
Had ergocalciferol but no on qd OTC. rec qd [facility] Labs

## 2014-05-14 NOTE — Assessment & Plan Note (Signed)
At some point was diagnosed with glaucoma, recommend to see her ophthalmologist

## 2014-05-15 LAB — VITAMIN D 25 HYDROXY (VIT D DEFICIENCY, FRACTURES): VIT D 25 HYDROXY: 29 ng/mL — AB (ref 30–89)

## 2014-05-16 ENCOUNTER — Encounter: Payer: Self-pay | Admitting: *Deleted

## 2014-05-16 MED ORDER — VITAMIN D (ERGOCALCIFEROL) 1.25 MG (50000 UNIT) PO CAPS: 50000.0000 [IU] | ORAL_CAPSULE | ORAL | Status: DC

## 2014-05-16 NOTE — Addendum Note (Signed)
Addended by: Baldwin JamaicaJOHNSON, Marcele Kosta G on: 05/16/2014 11:19 AM   Modules accepted: Orders

## 2014-05-16 NOTE — Progress Notes (Signed)
Letter sent with lab results and paper prescription for Vit d

## 2014-05-30 ENCOUNTER — Other Ambulatory Visit: Payer: Self-pay | Admitting: *Deleted

## 2014-05-30 MED ORDER — LEVOTHYROXINE SODIUM 50 MCG PO TABS: 50.0000 ug | ORAL_TABLET | Freq: Every day | ORAL | Status: DC

## 2014-05-30 NOTE — Telephone Encounter (Signed)
Rx sent to the pharmacy by e-script.//AB/CMA 

## 2014-06-01 ENCOUNTER — Telehealth: Payer: Self-pay | Admitting: Internal Medicine

## 2014-06-01 NOTE — Telephone Encounter (Signed)
Let patient know that Rx has been sent in.

## 2014-06-01 NOTE — Telephone Encounter (Signed)
Pt came into office today and is requesting a refill on her Levothyroxine tab.  Pt stated she only has one pill left.  Pharmacy is [facility].  Please advise.

## 2014-06-01 NOTE — Telephone Encounter (Signed)
rx sent 05/30/14 to [facility].

## 2014-10-22 ENCOUNTER — Encounter: Payer: Self-pay | Admitting: Internal Medicine

## 2014-10-22 ENCOUNTER — Ambulatory Visit: Payer: Commercial Managed Care - HMO | Admitting: Internal Medicine

## 2014-10-22 ENCOUNTER — Ambulatory Visit (INDEPENDENT_AMBULATORY_CARE_PROVIDER_SITE_OTHER): Payer: Commercial Managed Care - HMO | Admitting: Internal Medicine

## 2014-10-22 VITALS — BP 128/75 | HR 77 | Temp 97.4°F | Ht 60.0 in | Wt 133.2 lb

## 2014-10-22 DIAGNOSIS — M159 Polyosteoarthritis, unspecified: Secondary | ICD-10-CM

## 2014-10-22 DIAGNOSIS — M150 Primary generalized (osteo)arthritis: Secondary | ICD-10-CM

## 2014-10-22 DIAGNOSIS — E033 Postinfectious hypothyroidism: Secondary | ICD-10-CM

## 2014-10-22 DIAGNOSIS — M8580 Other specified disorders of bone density and structure, unspecified site: Secondary | ICD-10-CM

## 2014-10-22 DIAGNOSIS — E559 Vitamin D deficiency, unspecified: Secondary | ICD-10-CM

## 2014-10-22 DIAGNOSIS — Z23 Encounter for immunization: Secondary | ICD-10-CM

## 2014-10-22 LAB — VITAMIN D 25 HYDROXY (VIT D DEFICIENCY, FRACTURES): VITD: 31.71 ng/mL (ref 30.00–100.00)

## 2014-10-22 MED ORDER — CYCLOBENZAPRINE HCL 10 MG PO TABS: 5.0000 mg | ORAL_TABLET | Freq: Every evening | ORAL | Status: DC | PRN

## 2014-10-22 MED ORDER — LEVOTHYROXINE SODIUM 50 MCG PO TABS: 50.0000 ug | ORAL_TABLET | Freq: Every day | ORAL | Status: DC

## 2014-10-22 NOTE — Progress Notes (Signed)
Pre visit review using our clinic review tool, if applicable. No additional management support is needed unless otherwise documented below in the visit note. 

## 2014-10-22 NOTE — Assessment & Plan Note (Signed)
Good compliance w/ medication, needs a refill, last TSH satisfactory

## 2014-10-22 NOTE — Assessment & Plan Note (Signed)
Did not proceed with a bone density test, encourage her to do

## 2014-10-22 NOTE — Assessment & Plan Note (Signed)
RF flexeril

## 2014-10-22 NOTE — Patient Instructions (Signed)
Get your blood work before you leave      Please come back to the office by 04-2015  for a physical exam. Come back fasting    

## 2014-10-22 NOTE — Progress Notes (Signed)
   Subjective:    Patient ID: Angela NewportRosario Givhan, female    DOB: 03/19/1946, 68 y.o.   MRN: [id number]  DOS:  10/22/2014 Type of visit - description : rov Interval history: In general doing well. Hypothyroidism, good compliance with medications, needs a refill Neck pain, good compliance with Flexeril  Low vitamin D, did not take ergocalciferol but taking OTC supplements, see assessment and plan Osteopenia, did not proceed with bone density test.   ROS Denies chest pain or difficulty breathing No nausea, vomiting, diarrhea No anxiety or depression. In general feels well  Past Medical History  Diagnosis Date  . Urolithiasis 2007,2009  . Hypothyroidism   . Osteopenia   . Glaucoma   . Vitamin D deficiency 09/30/2012    Past Surgical History  Procedure Laterality Date  . [place]astric [place]  2005    [place]  . Cholecystectomy    . Abdominal hysterectomy  1990    no oophorectomy    History   Social History  . Marital Status: Married    Spouse Name: N/A    Number of Children: 2  . Years of Education: N/A   Occupational History  . 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     Social History Main Topics  . Smoking status: Never Smoker   . Smokeless tobacco: Never Used  . Alcohol Use: Yes     Comment: rarely  . Drug Use: No  . Sexual Activity: Not on file   Other Topics Concern  . Not on file   Social History Narrative   From [place], married to Fillmoreesar x 39 years, husband has dementia, improved lately.              Medication List       This list is accurate as of: 10/22/14 11:59 PM.  Always use your most recent med list.               CALCIUM + D PO  Take by mouth.     cyclobenzaprine 10 MG tablet  Commonly known as:  FLEXERIL  Take 0.5-1 tablets (5-10 mg total) by mouth at bedtime as needed for muscle spasms (neck pain).     levothyroxine 50 MCG tablet  Commonly known as:  SYNTHROID, LEVOTHROID  Take 1 tablet (50 mcg total) by mouth daily.     vitamin  B-12 100 MCG tablet  Commonly known as:  CYANOCOBALAMIN  Take 1 tablet (100 mcg total) by mouth daily.           Objective:   Physical Exam BP 128/75  Pulse 77  Temp(Src) 97.4 F (36.3 C) (Oral)  Ht 5' (1.524 m)  Wt 133 lb 4 oz (60.442 kg)  BMI 26.02 kg/m2  SpO2 99% General -- alert, well-developed, NAD.   Lungs -- normal respiratory effort, no intercostal retractions, no accessory muscle use, and normal breath sounds.  Heart-- normal rate, regular rhythm, no murmur.  Extremities-- no pretibial edema bilaterally  Neurologic--  alert & oriented X3. Speech normal, gait appropriate for age, strength symmetric and appropriate for age.  Psych-- Cognition and judgment appear intact. Cooperative with normal attention span and concentration. No anxious or depressed appearing.     Assessment & Plan:  High versus regular flu shot does discuss, elected high dose

## 2014-10-22 NOTE — Assessment & Plan Note (Signed)
Last vitamin D level, was prescribed ergocalciferol but did not take. Currently on OTC vitamin D 5000 units Plan: Continue with the same, labs

## 2014-10-29 ENCOUNTER — Ambulatory Visit (INDEPENDENT_AMBULATORY_CARE_PROVIDER_SITE_OTHER)
Admission: RE | Admit: 2014-10-29 | Discharge: 2014-10-29 | Disposition: A | Discharge status: Home / Self Care | Payer: Commercial Managed Care - HMO | Source: Ambulatory Visit | Attending: Internal Medicine | Admitting: Internal Medicine

## 2014-10-29 DIAGNOSIS — M899 Disorder of bone, unspecified: Secondary | ICD-10-CM

## 2014-10-29 DIAGNOSIS — N959 Unspecified menopausal and perimenopausal disorder: Secondary | ICD-10-CM

## 2014-10-29 DIAGNOSIS — M949 Disorder of cartilage, unspecified: Secondary | ICD-10-CM

## 2014-11-14 ENCOUNTER — Ambulatory Visit: Payer: Commercial Managed Care - HMO | Admitting: Internal Medicine

## 2015-01-29 ENCOUNTER — Other Ambulatory Visit: Payer: Self-pay

## 2015-01-29 ENCOUNTER — Telehealth: Payer: Self-pay | Admitting: Internal Medicine

## 2015-01-29 DIAGNOSIS — H269 Unspecified cataract: Secondary | ICD-10-CM

## 2015-01-29 NOTE — Telephone Encounter (Signed)
Please inform Pt, she will need to contact her insurance and see what medication they will cover in place of her cyclobenazeprine (Flexeril), we have no way of knowing this. Thank you.

## 2015-01-29 NOTE — Telephone Encounter (Signed)
Caller name: Tarena Relation to pt: Call back number: [phone] [facility] [place] [facility] [id number]  Reason for call: Pt came in office stating that has [facility] and had the rx  CYCLOBENZAPRINE (FLExERIL) 10 mg take 1/2 or 1 tablet at bedtime, that the Insurance is not covering this prescription and wants to have another prescription that can replace this one that the insurance will cover. Please advise.

## 2015-01-29 NOTE — Telephone Encounter (Signed)
Please advise FYI  

## 2015-01-30 NOTE — Telephone Encounter (Signed)
Called Pt and informed the below, pt understood and stated that will call insurance to verify and will call us back to inform what medication will cover Flexeril under her insurance. Thank you.

## 2015-01-31 NOTE — Telephone Encounter (Signed)
Noted. Awaiting Pt response.

## 2015-03-27 ENCOUNTER — Other Ambulatory Visit: Payer: Self-pay | Admitting: Internal Medicine

## 2015-03-27 DIAGNOSIS — Z1231 Encounter for screening mammogram for malignant neoplasm of breast: Secondary | ICD-10-CM

## 2015-03-29 ENCOUNTER — Ambulatory Visit (HOSPITAL_BASED_OUTPATIENT_CLINIC_OR_DEPARTMENT_OTHER)
Admission: RE | Admit: 2015-03-29 | Discharge: 2015-03-29 | Disposition: A | Discharge status: Home / Self Care | Payer: Commercial Managed Care - HMO | Source: Ambulatory Visit | Attending: Internal Medicine | Admitting: Internal Medicine

## 2015-03-29 DIAGNOSIS — Z1231 Encounter for screening mammogram for malignant neoplasm of breast: Secondary | ICD-10-CM | POA: Insufficient documentation

## 2015-04-26 ENCOUNTER — Telehealth: Payer: Self-pay | Admitting: Internal Medicine

## 2015-04-26 NOTE — Telephone Encounter (Signed)
PRE VISIT LETTER FOR ANNUAL EXAM MAILED °

## 2015-05-16 ENCOUNTER — Encounter: Payer: Self-pay | Admitting: *Deleted

## 2015-05-16 ENCOUNTER — Telehealth: Payer: Self-pay | Admitting: *Deleted

## 2015-05-16 NOTE — Telephone Encounter (Signed)
Pre-Visit Call completed with patient and chart updated.   Pre-Visit Info documented in Specialty Comments under [employer].    

## 2015-05-17 ENCOUNTER — Ambulatory Visit (INDEPENDENT_AMBULATORY_CARE_PROVIDER_SITE_OTHER): Payer: Commercial Managed Care - HMO | Admitting: Internal Medicine

## 2015-05-17 ENCOUNTER — Encounter: Payer: Self-pay | Admitting: Internal Medicine

## 2015-05-17 VITALS — BP 124/62 | HR 67 | Temp 98.5°F | Ht 60.0 in | Wt 134.5 lb

## 2015-05-17 DIAGNOSIS — G4700 Insomnia, unspecified: Secondary | ICD-10-CM

## 2015-05-17 DIAGNOSIS — E559 Vitamin D deficiency, unspecified: Secondary | ICD-10-CM

## 2015-05-17 DIAGNOSIS — Z0000 Encounter for general adult medical examination without abnormal findings: Secondary | ICD-10-CM

## 2015-05-17 DIAGNOSIS — Z23 Encounter for immunization: Secondary | ICD-10-CM

## 2015-05-17 DIAGNOSIS — M8580 Other specified disorders of bone density and structure, unspecified site: Secondary | ICD-10-CM

## 2015-05-17 DIAGNOSIS — K222 Esophageal obstruction: Secondary | ICD-10-CM

## 2015-05-17 DIAGNOSIS — E033 Postinfectious hypothyroidism: Secondary | ICD-10-CM

## 2015-05-17 LAB — TSH: TSH: 5.57 u[IU]/mL — AB (ref 0.35–4.50)

## 2015-05-17 MED ORDER — LEVOTHYROXINE SODIUM 75 MCG PO TABS: 75.0000 ug | ORAL_TABLET | Freq: Every day | ORAL | Status: DC

## 2015-05-17 MED ORDER — LEVOTHYROXINE SODIUM 50 MCG PO TABS: 50.0000 ug | ORAL_TABLET | Freq: Every day | ORAL | Status: DC

## 2015-05-17 NOTE — Progress Notes (Signed)
Pre visit review using our clinic review tool, if applicable. No additional management support is needed unless otherwise documented below in the visit note. 

## 2015-05-17 NOTE — Assessment & Plan Note (Signed)
Very rarely has symptoms. Recommend observation

## 2015-05-17 NOTE — Assessment & Plan Note (Addendum)
Good compliance w/  medication, check labs 

## 2015-05-17 NOTE — Assessment & Plan Note (Signed)
Bone density 10/29/2014 osteopenia, recommend calcium, vitamin D and stay active. History of low vitamin D. Will check labs

## 2015-05-17 NOTE — Patient Instructions (Signed)
Get your blood work before you leave    Come back to the office in 1 year  for a physical exam  Please schedule an appointment at the front desk    Come back fasting       Fall Prevention and Home Safety Falls cause injuries and can affect all age groups. It is possible to use preventive measures to significantly decrease the likelihood of falls. There are many simple measures which can make your home safer and prevent falls. OUTDOORS  Repair cracks and edges of walkways and driveways.  Remove high doorway thresholds.  Trim shrubbery on the main path into your home.  Have good outside lighting.  Clear walkways of tools, rocks, debris, and clutter.  Check that handrails are not broken and are securely fastened. Both sides of steps should have handrails.  Have leaves, snow, and ice cleared regularly.  Use sand or salt on walkways during winter months.  In the garage, clean up grease or oil spills. BATHROOM  Install night lights.  Install grab bars by the toilet and in the tub and shower.  Use non-skid mats or decals in the tub or shower.  Place a plastic non-slip stool in the shower to sit on, if needed.  Keep floors dry and clean up all water on the floor immediately.  Remove soap buildup in the tub or shower on a regular basis.  Secure bath mats with non-slip, double-sided rug tape.  Remove throw rugs and tripping hazards from the floors. BEDROOMS  Install night lights.  Make sure a bedside light is easy to reach.  Do not use oversized bedding.  Keep a telephone by your bedside.  Have a firm chair with side arms to use for getting dressed.  Remove throw rugs and tripping hazards from the floor. KITCHEN  Keep handles on pots and pans turned toward the center of the stove. Use back burners when possible.  Clean up spills quickly and allow time for drying.  Avoid walking on wet floors.  Avoid hot utensils and knives.  Position shelves so they are not  too high or low.  Place commonly used objects within easy reach.  If necessary, use a sturdy step stool with a grab bar when reaching.  Keep electrical cables out of the way.  Do not use floor polish or wax that makes floors slippery. If you must use wax, use non-skid floor wax.  Remove throw rugs and tripping hazards from the floor. STAIRWAYS  Never leave objects on stairs.  Place handrails on both sides of stairways and use them. Fix any loose handrails. Make sure handrails on both sides of the stairways are as long as the stairs.  Check carpeting to make sure it is firmly attached along stairs. Make repairs to worn or loose carpet promptly.  Avoid placing throw rugs at the top or bottom of stairways, or properly secure the rug with carpet tape to prevent slippage. Get rid of throw rugs, if possible.  Have an electrician put in a light switch at the top and bottom of the stairs. OTHER FALL PREVENTION TIPS  Wear low-heel or rubber-soled shoes that are supportive and fit well. Wear closed toe shoes.  When using a stepladder, make sure it is fully opened and both spreaders are firmly locked. Do not climb a closed stepladder.  Add color or contrast paint or tape to grab bars and handrails in your home. Place contrasting color strips on first and last steps.  Learn and   use mobility aids as needed. Install an electrical emergency response system.  Turn on lights to avoid dark areas. Replace light bulbs that burn out immediately. Get light switches that glow.  Arrange furniture to create clear pathways. Keep furniture in the same place.  Firmly attach carpet with non-skid or double-sided tape.  Eliminate uneven floor surfaces.  Select a carpet pattern that does not visually hide the edge of steps.  Be aware of all pets. OTHER HOME SAFETY TIPS  Set the water temperature for 120 F (48.8 C).  Keep emergency numbers on or near the telephone.  Keep smoke detectors on every  level of the home and near sleeping areas. Document Released: 12/04/2002 Document Revised: 06/14/2012 Document Reviewed: 03/04/2012 ExitCare Patient Information 2015 [employer]. This information is not intended to replace advice given to you by your health care provider. Make sure you discuss any questions you have with your health care provider.   Preventive Care for Adults Ages 65 and over  Blood pressure check.** / Every 1 to 2 years.  Lipid and cholesterol check.**/ Every 5 years beginning at age 20.  Lung cancer screening. / Every year if you are aged 55-80 years and have a 30-pack-year history of smoking and currently smoke or have quit within the past 15 years. Yearly screening is stopped once you have quit smoking for at least 15 years or develop a health problem that would prevent you from having lung cancer treatment.  Fecal occult blood test (FOBT) of stool. / Every year beginning at age 50 and continuing until age 75. You may not have to do this test if you get a colonoscopy every 10 years.  Flexible sigmoidoscopy** or colonoscopy.** / Every 5 years for a flexible sigmoidoscopy or every 10 years for a colonoscopy beginning at age 50 and continuing until age 75.  Hepatitis C blood test.** / For all people born from 1945 through 1965 and any individual with known risks for hepatitis C.  Abdominal aortic aneurysm (AAA) screening.** / A one-time screening for ages 65 to 75 years who are current or former smokers.  Skin self-exam. / Monthly.  Influenza vaccine. / Every year.  Tetanus, diphtheria, and acellular pertussis (Tdap/Td) vaccine.** / 1 dose of Td every 10 years.  Varicella vaccine.** / Consult your health care provider.  Zoster vaccine.** / 1 dose for adults aged 60 years or older.  Pneumococcal 13-valent conjugate (PCV13) vaccine.** / Consult your health care provider.  Pneumococcal polysaccharide (PPSV23) vaccine.** / 1 dose for all adults aged 65 years and  older.  Meningococcal vaccine.** / Consult your health care provider.  Hepatitis A vaccine.** / Consult your health care provider.  Hepatitis B vaccine.** / Consult your health care provider.  Haemophilus influenzae type b (Hib) vaccine.** / Consult your health care provider. **Family history and personal history of risk and conditions may change your health care provider's recommendations. Document Released: 02/09/2002 Document Revised: 12/19/2013 Document Reviewed: 05/11/2011 ExitCare Patient Information 2015 [employer]. This information is not intended to replace advice given to you by your health care provider. Make sure you discuss any questions you have with your health care provider.   

## 2015-05-17 NOTE — Assessment & Plan Note (Signed)
See comments under osteopenia

## 2015-05-17 NOTE — Assessment & Plan Note (Addendum)
Not a major issue, rarely takes Flexeril

## 2015-05-17 NOTE — Assessment & Plan Note (Addendum)
Td 2007 Shingles inmunization 2012 Pneumonia shot 2014 prevnar 05-17-15  Cscope 2005, [place] polyps per patient ([place]  , again had a Cscope w/ Dr Loreta AveMann 12-2010, tics, no polyps, next in 10 years per report   last  MMG neg 03-2015  (-) s/p hyst: no h/o cancer or abnormal PAP, [facility], reports a PAP 2015   Diet   Exercise--discussed Labs reviewed, labs last year stable. Will check a TSH and vitamin D

## 2015-05-17 NOTE — Progress Notes (Addendum)
Subjective:    Patient ID: Angela Valencia, female    DOB: 08/24/1946, 69 y.o.   MRN: [id number]  DOS:  05/17/2015 Type of visit - description :    Here for Medicare AWV:  1. Risk factors based on Past M, S, F history: reviewed 2. Physical Activities: goes to the gym, less than before, encouraged to go 5-6/week 3. Depression/mood:  (-) screening   4. Hearing:  No problemss noted or reported   5. ADL's:  Completely independent, drives   6. Fall Risk: no recent falls,  see instructions   7. home Safety: does feel safe at home   8. Height, weight, &visual acuity: see VS, cataracts? Saw a specialist, has a f/u schedule  9. Counseling: provided 10. Labs ordered based on risk factors: if needed   11. Referral Coordination: if needed 12.  Care Plan, see assessment and plan   13.   Cognitive Assessment: motor skills and cognition wnl 14. Care team updated 15. End of life care discussed  In addition, today we discussed the following: Osteopenia, on calcium and vitamin D, less active than before. Hypothyroidism, good compliance with medications Insomnia, doing well, really needs Flexeril.   Review of Systems Constitutional: No fever, chills. No unexplained wt changes. No unusual sweats HEENT: No dental problems, ear discharge, facial swelling, voice changes. No eye discharge, redness or intolerance to light Respiratory: No wheezing or difficulty breathing. No cough , mucus production Cardiovascular: No CP, leg swelling or palpitations GI:  Had an episode of nausea and vomiting a week ago, self resolved, no diarrhea or abdominal pain.  No blood in the stools. Rare  dysphagia   Endocrine: No polyphagia, polyuria or polydipsia GU: No dysuria, gross hematuria, difficulty urinating. No urinary urgency or frequency. Musculoskeletal: No joint swellings or unusual aches or pains Skin: No change in the color of the skin, palor or rash Allergic, immunologic: No environmental allergies or food  allergies Neurological: No dizziness or syncope. No headaches. No diplopia, slurred speech, motor deficits, facial numbness Hematological: No enlarged lymph nodes, easy bruising or bleeding Psychiatry: No suicidal ideas, hallucinations, behavior problems or confusion. No unusual/severe anxiety or depression.     Past Medical History  Diagnosis Date  . Urolithiasis 2007,2009  . Hypothyroidism   . Osteopenia   . Glaucoma   . Vitamin D deficiency 09/30/2012    Past Surgical History  Procedure Laterality Date  . Gastric bypass  2005    [place]  . Cholecystectomy    . Abdominal hysterectomy  1990    no oophorectomy    History   Social History  . Marital Status: Married    Spouse Name: N/A  . Number of Children: 2  . Years of Education: N/A   Occupational History  . 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     Social History Main Topics  . Smoking status: Never Smoker   . Smokeless tobacco: Never Used  . Alcohol Use: Yes     Comment: rarely  . Drug Use: No  . Sexual Activity: Not on file   Other Topics Concern  . Not on file   Social History Narrative   From [place], married [place] x 40 years, husband has dementia, improved lately.           Family History  Problem Relation Age of Onset  . Stroke Father   . Coronary artery disease Neg Hx   . Cancer Neg Hx     colon,  breast  . Stomach cancer Other     uncle  . Diabetes Neg Hx        Medication List       This list is accurate as of: 05/17/15 11:59 PM.  Always use your most recent med list.               CALCIUM + D PO  Take by mouth.     cyclobenzaprine 10 MG tablet  Commonly known as:  FLEXERIL  Take 0.5-1 tablets (5-10 mg total) by mouth at bedtime as needed for muscle spasms (neck pain).     levothyroxine 75 MCG tablet  Commonly known as:  SYNTHROID, LEVOTHROID  Take 1 tablet (75 mcg total) by mouth daily before breakfast.     vitamin B-12 100 MCG tablet  Commonly known as:   CYANOCOBALAMIN  Take 1 tablet (100 mcg total) by mouth daily.           Objective:   Physical Exam BP 124/62 mmHg  Pulse 67  Temp(Src) 98.5 F (36.9 C) (Oral)  Ht 5' (1.524 m)  Wt 134 lb 8 oz (61.009 kg)  BMI 26.27 kg/m2  SpO2 97% General:   Well developed, well nourished . NAD.  Neck:  Full range of motion. Supple. No  thyromegaly   HEENT:  Normocephalic . Face symmetric, atraumatic Lungs:  CTA B Normal respiratory effort, no intercostal retractions, no accessory muscle use. Heart: RRR,  no murmur.  No pretibial edema bilaterally  Abdomen:  Not distended, soft, non-tender. No rebound or rigidity. No mass,organomegaly Skin: Exposed areas without rash. Not pale. Not jaundice Neurologic:  alert & oriented X3.  Speech normal, gait appropriate for age and unassisted Strength symmetric and appropriate for age.  Psych: Cognition and judgment appear intact.  Cooperative with normal attention span and concentration.  Behavior appropriate. No anxious or depressed appearing.        Assessment & Plan:

## 2015-05-19 NOTE — Addendum Note (Signed)
Addended by: Willow OraPAZ, Zilphia Kozinski E on: 05/19/2015 10:29 AM   Modules accepted: Kipp BroodSmartSet

## 2015-05-21 LAB — VITAMIN D 1,25 DIHYDROXY
Vitamin D 1, 25 (OH)2 Total: 89 pg/mL — ABNORMAL HIGH (ref 18–72)
Vitamin D2 1, 25 (OH)2: 13 pg/mL
Vitamin D3 1, 25 (OH)2: 76 pg/mL

## 2015-06-28 ENCOUNTER — Other Ambulatory Visit (INDEPENDENT_AMBULATORY_CARE_PROVIDER_SITE_OTHER): Payer: Commercial Managed Care - HMO

## 2015-06-28 DIAGNOSIS — E033 Postinfectious hypothyroidism: Secondary | ICD-10-CM | POA: Diagnosis not present

## 2015-06-28 LAB — TSH: TSH: 1.97 u[IU]/mL (ref 0.35–4.50)

## 2015-08-05 ENCOUNTER — Telehealth: Payer: Self-pay | Admitting: Internal Medicine

## 2015-08-05 MED ORDER — LEVOTHYROXINE SODIUM 75 MCG PO TABS: 75.0000 ug | ORAL_TABLET | Freq: Every day | ORAL | Status: DC

## 2015-08-05 NOTE — Telephone Encounter (Signed)
Rx sent to [facility]

## 2015-08-05 NOTE — Telephone Encounter (Signed)
Caller name: Nakari Relation to pt: Self  Call back number: [phone] Pharmacy:[facility] - [place], [place] - [address]  Reason for call: Pt requesting medication refill for levothyroxine (SYNTHROID, LEVOTHROID) 75 MCG tablet. Please advise.

## 2016-04-03 ENCOUNTER — Other Ambulatory Visit: Payer: Self-pay | Admitting: Internal Medicine

## 2016-04-07 ENCOUNTER — Other Ambulatory Visit: Payer: Self-pay | Admitting: Internal Medicine

## 2016-04-07 DIAGNOSIS — Z1231 Encounter for screening mammogram for malignant neoplasm of breast: Secondary | ICD-10-CM

## 2016-04-13 ENCOUNTER — Ambulatory Visit (HOSPITAL_BASED_OUTPATIENT_CLINIC_OR_DEPARTMENT_OTHER)
Admission: RE | Admit: 2016-04-13 | Discharge: 2016-04-13 | Disposition: A | Discharge status: Home / Self Care | Payer: Commercial Managed Care - HMO | Source: Ambulatory Visit | Attending: Internal Medicine | Admitting: Internal Medicine

## 2016-04-13 DIAGNOSIS — Z1231 Encounter for screening mammogram for malignant neoplasm of breast: Secondary | ICD-10-CM | POA: Diagnosis not present

## 2016-05-20 ENCOUNTER — Encounter: Payer: Commercial Managed Care - HMO | Admitting: Internal Medicine

## 2016-05-25 ENCOUNTER — Encounter: Payer: Commercial Managed Care - HMO | Admitting: Internal Medicine

## 2016-07-21 ENCOUNTER — Encounter: Payer: Commercial Managed Care - HMO | Admitting: Internal Medicine

## 2016-07-23 ENCOUNTER — Encounter: Payer: Self-pay | Admitting: Internal Medicine

## 2016-07-23 ENCOUNTER — Ambulatory Visit (INDEPENDENT_AMBULATORY_CARE_PROVIDER_SITE_OTHER): Payer: Commercial Managed Care - HMO | Admitting: Internal Medicine

## 2016-07-23 VITALS — BP 118/76 | HR 59 | Temp 98.2°F | Resp 14 | Ht 60.0 in | Wt 137.4 lb

## 2016-07-23 DIAGNOSIS — D649 Anemia, unspecified: Secondary | ICD-10-CM

## 2016-07-23 DIAGNOSIS — Z9889 Other specified postprocedural states: Secondary | ICD-10-CM

## 2016-07-23 DIAGNOSIS — M8580 Other specified disorders of bone density and structure, unspecified site: Secondary | ICD-10-CM

## 2016-07-23 DIAGNOSIS — Z0000 Encounter for general adult medical examination without abnormal findings: Secondary | ICD-10-CM

## 2016-07-23 DIAGNOSIS — E039 Hypothyroidism, unspecified: Secondary | ICD-10-CM | POA: Diagnosis not present

## 2016-07-23 DIAGNOSIS — Z9884 Bariatric surgery status: Secondary | ICD-10-CM

## 2016-07-23 DIAGNOSIS — E559 Vitamin D deficiency, unspecified: Secondary | ICD-10-CM

## 2016-07-23 NOTE — Progress Notes (Signed)
Pre visit review using our clinic review tool, if applicable. No additional management support is needed unless otherwise documented below in the visit note. 

## 2016-07-23 NOTE — Progress Notes (Signed)
Subjective:    Patient ID: Angela Valencia, female    DOB: 1946/10/22, 70 y.o.   MRN: [id number]  DOS:  07/23/2016 Type of visit - description : CPX Interval history: Recently went to donate blood and she was told she had mild anemia.   Review of Systems  Constitutional: No fever. No chills. No unexplained wt changes. No unusual sweats  HEENT: + dental problems, currently on antibiotics by her dentist. no ear discharge, no facial swelling, no voice changes. No eye discharge, no eye  redness , no  intolerance to light   Respiratory: No wheezing , no  difficulty breathing. No cough , no mucus production  Cardiovascular: No CP, no leg swelling , no  Palpitations  GI: Had the episode of nausea a few days ago, no associated abdominal pain, blood in the stools. Symptoms resolved. Some dysphagia at baseline.  no vomiting, no diarrhea , no  abdominal pain.  No blood in the stools.     Endocrine: No polyphagia, no polyuria , no polydipsia  GU: No dysuria, gross hematuria, difficulty urinating. No urinary urgency, no frequency.  Musculoskeletal: No joint swellings or unusual aches or pains  Skin: No change in the color of the skin, palor , no  Rash  Allergic, immunologic: No environmental allergies , no  food allergies  Neurological: No dizziness no  syncope. No headaches. No diplopia, no slurred, no slurred speech, no motor deficits, no facial  Numbness  Hematological: No enlarged lymph nodes, no easy bruising , no unusual bleedings  Psychiatry: No suicidal ideas, no hallucinations, no beavior problems, no confusion.  No unusual/severe anxiety, no depression  Past Medical History:  Diagnosis Date  . Glaucoma   . Hypothyroidism   . Osteopenia   . Urolithiasis 2007,2009  . Vitamin D deficiency 09/30/2012    Past Surgical History:  Procedure Laterality Date  . ABDOMINAL HYSTERECTOMY  1990   no oophorectomy  . CHOLECYSTECTOMY    . GASTRIC BYPASS  2005   [place]     Social History   Social History  . Marital status: Married    Spouse name: N/A  . Number of children: 2  . Years of education: N/A   Occupational History  . 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  [employer]   Social History Main Topics  . Smoking status: Never Smoker  . Smokeless tobacco: Never Used  . Alcohol use Yes     Comment: rarely  . Drug use: No  . Sexual activity: Not on file   Other Topics Concern  . Not on file   Social History Narrative   From [place], married to [place] x 40 years            Family History  Problem Relation Age of Onset  . Stroke Father   . Colon cancer Other   . Coronary artery disease Neg Hx   . Diabetes Neg Hx   . Breast cancer Neg Hx        Medication List       Accurate as of 07/23/16 11:59 PM. Always use your most recent med list.          CALCIUM + D PO Take by mouth.   chlorhexidine 0.12 % solution Commonly known as:  PERIDEX Use as directed 15 mLs in the mouth or throat 2 (two) times daily as needed.   ibuprofen 800 MG tablet Commonly known as:  ADVIL,MOTRIN Take 800 mg by mouth every 6 (six) hours  as needed (toothache).   levothyroxine 75 MCG tablet Commonly known as:  SYNTHROID, LEVOTHROID Take 1 tablet (75 mcg total) by mouth daily before breakfast.   penicillin v potassium 500 MG tablet Commonly known as:  VEETID Take 500 mg by mouth 4 (four) times daily.   vitamin B-12 100 MCG tablet Commonly known as:  CYANOCOBALAMIN Take 1 tablet (100 mcg total) by mouth daily.          Objective:   Physical Exam BP 118/76 (BP Location: Left Arm, Patient Position: Sitting, Cuff Size: Small)   Pulse (!) 59   Temp 98.2 F (36.8 C) (Oral)   Resp 14   Ht 5' (1.524 m)   Wt 137 lb 6 oz (62.3 kg)   SpO2 98%   BMI 26.83 kg/m   General:   Well developed, well nourished . NAD.  Neck: No  thyromegaly  HEENT:  Normocephalic . Face symmetric, atraumatic. Slightly pale? Lungs:  CTA B Normal respiratory effort, no  intercostal retractions, no accessory muscle use. Heart: RRR,  no murmur.  No pretibial edema bilaterally  Abdomen:  Not distended, soft, non-tender. No rebound or rigidity.   Skin: Exposed areas without rash.  Not jaundice Neurologic:  alert & oriented X3.  Speech normal, gait appropriate for age and unassisted Strength symmetric and appropriate for age.  Psych: Cognition and judgment appear intact.  Cooperative with normal attention span and concentration.  Behavior appropriate. No anxious or depressed appearing.\     Assessment & Plan:   Assessment Hypothyroidism H/o  Insomnia H/o gastric Bypass 2005 H/o esophageal stricture EGD dilatation ~ 2013 Dr Loreta Ave EGD performed 05/22/2013 while she was in [place]: Postsurgical changes were observed, had a  Dilatation (at the [facility]?) Osteopenia, vitamin D deficiency 12-2006 : T score -0.9 ;  05-2009 -1.5 ;  10-2014 T score -1.5 [facility] Glaucoma   PLAN: Hypothyroidism: Continue Synthroid. Check a TSH H/o gastric bypass, will check a B12, folic acid. esoph  stricture: sx at baseline Osteopenia, vit D def: On calcium and vitamin D.  Anemia: Was told she had anemia when she went to donate blood, she indeed looks pale. GI ROS negative except for a single episode of nausea. Not taking NSAIDs. Declined DRE. Will check a CBC, iron, ferritin and Hemoccults. RTC 3 months  Today, In addition to her physical exam I spent more than   15 min with the patient: >50% of the time counseling regards her chronic medical issues such as hypothyroidism, history of gastric bypass and assessing a new problem (anemia).

## 2016-07-23 NOTE — Assessment & Plan Note (Signed)
Td 2007;  Shingles inmunization 2012;  Pneumonia shot 2014;  prevnar 05-17-15  Cscope 2005, no polyps per patient ([place])  , again had a Cscope w/ Dr Loreta Ave 12-2010, tics, no polyps, next in 10 years per report   last  MMG neg 03-2016 (-) s/p hyst: no h/o cancer or abnormal PAP, saw gyn at [facility], reports a PAP 2015 ; plans to call them again  Diet  Exercise--discussed

## 2016-07-23 NOTE — Patient Instructions (Signed)
GO TO THE FRONT DESK Schedule your next appointment for a  routine checkup in 3 months  Schedule labs to be done within few days, fasting      Fall Prevention and Home Safety Falls cause injuries and can affect all age groups. It is possible to use preventive measures to significantly decrease the likelihood of falls. There are many simple measures which can make your home safer and prevent falls. OUTDOORS  Repair cracks and edges of walkways and driveways.  Remove high doorway thresholds.  Trim shrubbery on the main path into your home.  Have good outside lighting.  Clear walkways of tools, rocks, debris, and clutter.  Check that handrails are not broken and are securely fastened. Both sides of steps should have handrails.  Have leaves, snow, and ice cleared regularly.  Use sand or salt on walkways during winter months.  In the garage, clean up grease or oil spills. BATHROOM  Install night lights.  Install grab bars by the toilet and in the tub and shower.  Use non-skid mats or decals in the tub or shower.  Place a plastic non-slip stool in the shower to sit on, if needed.  Keep floors dry and clean up all water on the floor immediately.  Remove soap buildup in the tub or shower on a regular basis.  Secure bath mats with non-slip, double-sided rug tape.  Remove throw rugs and tripping hazards from the floors. BEDROOMS  Install night lights.  Make sure a bedside light is easy to reach.  Do not use oversized bedding.  Keep a telephone by your bedside.  Have a firm chair with side arms to use for getting dressed.  Remove throw rugs and tripping hazards from the floor. KITCHEN  Keep handles on pots and pans turned toward the center of the stove. Use back burners when possible.  Clean up spills quickly and allow time for drying.  Avoid walking on wet floors.  Avoid hot utensils and knives.  Position shelves so they are not too high or low.  Place  commonly used objects within easy reach.  If necessary, use a sturdy step stool with a grab bar when reaching.  Keep electrical cables out of the way.  Do not use floor polish or wax that makes floors slippery. If you must use wax, use non-skid floor wax.  Remove throw rugs and tripping hazards from the floor. STAIRWAYS  Never leave objects on stairs.  Place handrails on both sides of stairways and use them. Fix any loose handrails. Make sure handrails on both sides of the stairways are as long as the stairs.  Check carpeting to make sure it is firmly attached along stairs. Make repairs to worn or loose carpet promptly.  Avoid placing throw rugs at the top or bottom of stairways, or properly secure the rug with carpet tape to prevent slippage. Get rid of throw rugs, if possible.  Have an electrician put in a light switch at the top and bottom of the stairs. OTHER FALL PREVENTION TIPS  Wear low-heel or rubber-soled shoes that are supportive and fit well. Wear closed toe shoes.  When using a stepladder, make sure it is fully opened and both spreaders are firmly locked. Do not climb a closed stepladder.  Add color or contrast paint or tape to grab bars and handrails in your home. Place contrasting color strips on first and last steps.  Learn and use mobility aids as needed. Install an electrical emergency response system.  Turn on lights to avoid dark areas. Replace light bulbs that burn out immediately. Get light switches that glow.  Arrange furniture to create clear pathways. Keep furniture in the same place.  Firmly attach carpet with non-skid or double-sided tape.  Eliminate uneven floor surfaces.  Select a carpet pattern that does not visually hide the edge of steps.  Be aware of all pets. OTHER HOME SAFETY TIPS  Set the water temperature for 120 F (48.8 C).  Keep emergency numbers on or near the telephone.  Keep smoke detectors on every level of the home and near  sleeping areas. Document Released: 12/04/2002 Document Revised: 06/14/2012 Document Reviewed: 03/04/2012 [facility] Patient Information 2015 [place], [place]. This information is not intended to replace advice given to you by your health care provider. Make sure you discuss any questions you have with your health care provider.   Preventive Care for Adults Ages 30 and over  Blood pressure check.** / Every 1 to 2 years.  Lipid and cholesterol check.**/ Every 5 years beginning at age 37.  Lung cancer screening. / Every year if you are aged 55-80 years and have a 30-pack-year history of smoking and currently smoke or have quit within the past 15 years. Yearly screening is stopped once you have quit smoking for at least 15 years or develop a health problem that would prevent you from having lung cancer treatment.  Fecal occult blood test (FOBT) of stool. / Every year beginning at age 64 and continuing until age 36. You may not have to do this test if you get a colonoscopy every 10 years.  Flexible sigmoidoscopy** or colonoscopy.** / Every 5 years for a flexible sigmoidoscopy or every 10 years for a colonoscopy beginning at age 44 and continuing until age 37.  Hepatitis C blood test.** / For all people born from [birth year] through 1964 and any individual with known risks for hepatitis C.  Abdominal aortic aneurysm (AAA) screening.** / A one-time screening for ages 34 to 28 years who are current or former smokers.  Skin self-exam. / Monthly.  Influenza vaccine. / Every year.  Tetanus, diphtheria, and acellular pertussis (Tdap/Td) vaccine.** / 1 dose of Td every 10 years.  Varicella vaccine.** / Consult your health care provider.  Zoster vaccine.** / 1 dose for adults aged 12 years or older.  Pneumococcal 13-valent conjugate (PCV13) vaccine.** / Consult your health care provider.  Pneumococcal polysaccharide (PPSV23) vaccine.** / 1 dose for all adults aged 46 years and older.  Meningococcal  vaccine.** / Consult your health care provider.  Hepatitis A vaccine.** / Consult your health care provider.  Hepatitis B vaccine.** / Consult your health care provider.  Haemophilus influenzae type b (Hib) vaccine.** / Consult your health care provider. **Family history and personal history of risk and conditions may change your health care provider's recommendations. Document Released: 02/09/2002 Document Revised: 12/19/2013 Document Reviewed: 05/11/2011 [facility] Patient Information 2015 [place], [place]. This information is not intended to replace advice given to you by your health care provider. Make sure you discuss any questions you have with your health care provider.  '

## 2016-07-24 ENCOUNTER — Other Ambulatory Visit (INDEPENDENT_AMBULATORY_CARE_PROVIDER_SITE_OTHER): Payer: Commercial Managed Care - HMO

## 2016-07-24 DIAGNOSIS — Z09 Encounter for follow-up examination after completed treatment for conditions other than malignant neoplasm: Secondary | ICD-10-CM | POA: Insufficient documentation

## 2016-07-24 DIAGNOSIS — E039 Hypothyroidism, unspecified: Secondary | ICD-10-CM | POA: Diagnosis not present

## 2016-07-24 DIAGNOSIS — Z0000 Encounter for general adult medical examination without abnormal findings: Secondary | ICD-10-CM | POA: Diagnosis not present

## 2016-07-24 DIAGNOSIS — D649 Anemia, unspecified: Secondary | ICD-10-CM

## 2016-07-24 DIAGNOSIS — E559 Vitamin D deficiency, unspecified: Secondary | ICD-10-CM

## 2016-07-24 LAB — COMPREHENSIVE METABOLIC PANEL
ALT: 9 U/L (ref 0–35)
AST: 16 U/L (ref 0–37)
Albumin: 4.1 g/dL (ref 3.5–5.2)
Alkaline Phosphatase: 117 U/L (ref 39–117)
BILIRUBIN TOTAL: 0.5 mg/dL (ref 0.2–1.2)
BUN: 15 mg/dL (ref 6–23)
CALCIUM: 9.1 mg/dL (ref 8.4–10.5)
CO2: 31 meq/L (ref 19–32)
CREATININE: 0.6 mg/dL (ref 0.40–1.20)
Chloride: 106 mEq/L (ref 96–112)
GFR: 105.44 mL/min (ref >60.00)
GLUCOSE: 90 mg/dL (ref 70–99)
Potassium: 4.1 mEq/L (ref 3.5–5.1)
SODIUM: 141 meq/L (ref 135–145)
Total Protein: 6.8 g/dL (ref 6.0–8.3)

## 2016-07-24 LAB — CBC WITH DIFFERENTIAL/PLATELET
BASOS ABS: 0.1 10*3/uL (ref 0.0–0.1)
Basophils Relative: 1.4 % (ref 0.0–3.0)
EOS ABS: 0.4 10*3/uL (ref 0.0–0.7)
Eosinophils Relative: 7.4 % — ABNORMAL HIGH (ref 0.0–5.0)
HCT: 33.5 % — ABNORMAL LOW (ref 36.0–46.0)
Hemoglobin: 10.9 g/dL — ABNORMAL LOW (ref 12.0–15.0)
LYMPHS PCT: 48.9 % — AB (ref 12.0–46.0)
Lymphs Abs: 2.9 10*3/uL (ref 0.7–4.0)
MCHC: 32.4 g/dL (ref 30.0–36.0)
MCV: 77.5 fl — AB (ref 78.0–100.0)
MONOS PCT: 4 % (ref 3.0–12.0)
Monocytes Absolute: 0.2 10*3/uL (ref 0.1–1.0)
Neutro Abs: 2.3 10*3/uL (ref 1.4–7.7)
Neutrophils Relative %: 38.3 % — ABNORMAL LOW (ref 43.0–77.0)
PLATELETS: 286 10*3/uL (ref 150.0–400.0)
RBC: 4.32 Mil/uL (ref 3.87–5.11)
RDW: 16.2 % — AB (ref 11.5–15.5)
WBC: 5.9 10*3/uL (ref 4.0–10.5)

## 2016-07-24 LAB — LIPID PANEL
CHOL/HDL RATIO: 2
Cholesterol: 156 mg/dL (ref 0–200)
HDL: 64.8 mg/dL (ref >39.00)
LDL Cholesterol: 79 mg/dL (ref 0–99)
NONHDL: 91.14
Triglycerides: 62 mg/dL (ref 0.0–149.0)
VLDL: 12.4 mg/dL (ref 0.0–40.0)

## 2016-07-24 LAB — IRON: Iron: 25 ug/dL — ABNORMAL LOW (ref 42–145)

## 2016-07-24 LAB — TSH: TSH: 5.74 u[IU]/mL — ABNORMAL HIGH (ref 0.35–4.50)

## 2016-07-24 LAB — FOLATE: FOLATE: 12.9 ng/mL (ref >5.9)

## 2016-07-24 LAB — VITAMIN B12

## 2016-07-24 LAB — FERRITIN: FERRITIN: 3.7 ng/mL — AB (ref 10.0–291.0)

## 2016-07-24 NOTE — Assessment & Plan Note (Signed)
Hypothyroidism: Continue Synthroid. Check a TSH H/o gastric bypass, will check a B12, folic acid. esoph  stricture: sx at baseline Osteopenia, vit D def: On calcium and vitamin D.  Anemia: Was told she had anemia when she went to donate blood, she indeed looks pale. GI ROS negative except for a single episode of nausea. Not taking NSAIDs. Declined DRE. Will check a CBC, iron, ferritin and Hemoccults. RTC 3 months

## 2016-07-28 ENCOUNTER — Telehealth: Payer: Self-pay | Admitting: Internal Medicine

## 2016-07-28 DIAGNOSIS — D649 Anemia, unspecified: Secondary | ICD-10-CM

## 2016-07-28 DIAGNOSIS — E039 Hypothyroidism, unspecified: Secondary | ICD-10-CM

## 2016-07-28 LAB — VITAMIN D 1,25 DIHYDROXY
VITAMIN D 1, 25 (OH) TOTAL: 75 pg/mL — AB (ref 18–72)
Vitamin D2 1, 25 (OH)2: 9 pg/mL
Vitamin D3 1, 25 (OH)2: 66 pg/mL

## 2016-07-28 MED ORDER — FERROUS SULFATE 325 (65 FE) MG PO TABS: 325.0000 mg | ORAL_TABLET | Freq: Two times a day (BID) | ORAL | 5 refills | Status: DC

## 2016-07-28 MED ORDER — FERROUS SULFATE 325 (65 FE) MG PO TABS: 325.0000 mg | ORAL_TABLET | Freq: Two times a day (BID) | ORAL | 1 refills | Status: AC

## 2016-07-28 MED ORDER — LEVOTHYROXINE SODIUM 100 MCG PO TABS: 100.0000 ug | ORAL_TABLET | Freq: Every day | ORAL | 3 refills | Status: DC

## 2016-07-28 MED ORDER — LEVOTHYROXINE SODIUM 100 MCG PO TABS: 100.0000 ug | ORAL_TABLET | Freq: Every day | ORAL | 0 refills | Status: DC

## 2016-07-28 NOTE — Telephone Encounter (Signed)
Pt came in office stating picked up her rx at her [facility] but pt states would like her refills sent to [facility] - [place], [place] - [phone]

## 2016-07-28 NOTE — Telephone Encounter (Signed)
GI referral placed, Synthroid 75 mcg d/c, and Synthroid 100 mcg 1 tab daily and FeSO4 325mg  1 tab bid sent to [facility]. TSH ordered to be completed in 6 weeks. Lab appt scheduled.

## 2016-07-28 NOTE — Telephone Encounter (Signed)
Please advise 

## 2016-07-28 NOTE — Addendum Note (Signed)
Addended byConrad Adell D on: 07/28/2016 03:51 PM   Modules accepted: Orders

## 2016-07-28 NOTE — Telephone Encounter (Signed)
Labs discussed with the patient, has developed iron deficiency anemia, TSH is elevated. Other labs are normal Angela Valencia: Enter a GI referral to Dr. Loreta Ave, DX anemia Send a prescription for FeSO4 325 mg 1 po bid #60, 5 RF Change Synthroid to 100 mcg daily #30, 3 refills Arrange a TSH in 6 weeks from today.

## 2016-07-28 NOTE — Telephone Encounter (Signed)
Rx's resent to [facility] mail order.

## 2016-08-17 ENCOUNTER — Telehealth: Payer: Self-pay | Admitting: Internal Medicine

## 2016-08-17 DIAGNOSIS — Z01419 Encounter for gynecological examination (general) (routine) without abnormal findings: Secondary | ICD-10-CM

## 2016-08-17 NOTE — Telephone Encounter (Signed)
Caller name: Hanley HaysRosario  Relation to pt: self  Call back number: [phone] Pharmacy:  Reason for call: Pt called requesting needs a referral for Gynecologist, pt states has [employer] as insurance. Please advise.

## 2016-08-17 NOTE — Telephone Encounter (Signed)
Referral placed.

## 2016-09-02 ENCOUNTER — Other Ambulatory Visit: Payer: Self-pay | Admitting: Gastroenterology

## 2016-09-02 DIAGNOSIS — R1310 Dysphagia, unspecified: Secondary | ICD-10-CM

## 2016-09-02 LAB — HM COLONOSCOPY

## 2016-09-07 ENCOUNTER — Other Ambulatory Visit: Payer: Commercial Managed Care - HMO

## 2016-09-08 ENCOUNTER — Ambulatory Visit
Admission: RE | Admit: 2016-09-08 | Discharge: 2016-09-08 | Disposition: A | Discharge status: Home / Self Care | Payer: Commercial Managed Care - HMO | Source: Ambulatory Visit | Attending: Gastroenterology | Admitting: Gastroenterology

## 2016-09-08 DIAGNOSIS — R1310 Dysphagia, unspecified: Secondary | ICD-10-CM

## 2016-09-09 ENCOUNTER — Other Ambulatory Visit (INDEPENDENT_AMBULATORY_CARE_PROVIDER_SITE_OTHER): Payer: Commercial Managed Care - HMO

## 2016-09-09 DIAGNOSIS — E039 Hypothyroidism, unspecified: Secondary | ICD-10-CM | POA: Diagnosis not present

## 2016-09-09 LAB — TSH: TSH: 1.09 u[IU]/mL (ref 0.35–4.50)

## 2016-09-10 ENCOUNTER — Encounter: Payer: Self-pay | Admitting: Gynecology

## 2016-09-10 ENCOUNTER — Ambulatory Visit (INDEPENDENT_AMBULATORY_CARE_PROVIDER_SITE_OTHER): Payer: Commercial Managed Care - HMO | Admitting: Gynecology

## 2016-09-10 VITALS — BP 140/84 | Ht 60.0 in | Wt 141.0 lb

## 2016-09-10 DIAGNOSIS — IMO0002 Reserved for concepts with insufficient information to code with codable children: Secondary | ICD-10-CM

## 2016-09-10 DIAGNOSIS — Z1272 Encounter for screening for malignant neoplasm of vagina: Secondary | ICD-10-CM | POA: Diagnosis not present

## 2016-09-10 DIAGNOSIS — Z23 Encounter for immunization: Secondary | ICD-10-CM | POA: Diagnosis not present

## 2016-09-10 DIAGNOSIS — M8580 Other specified disorders of bone density and structure, unspecified site: Secondary | ICD-10-CM | POA: Diagnosis not present

## 2016-09-10 DIAGNOSIS — Z01419 Encounter for gynecological examination (general) (routine) without abnormal findings: Secondary | ICD-10-CM | POA: Diagnosis not present

## 2016-09-10 DIAGNOSIS — N8110 Cystocele, unspecified: Secondary | ICD-10-CM | POA: Diagnosis not present

## 2016-09-10 DIAGNOSIS — N952 Postmenopausal atrophic vaginitis: Secondary | ICD-10-CM | POA: Diagnosis not present

## 2016-09-10 NOTE — Progress Notes (Signed)
Angela Valencia 07/19/1946 [id number]   History:    70 y.o.  for annual gyn exam who is a new patient to the practice and is a referral from her PCP Dr. Drue NovelPaz. He has been following her and doing her blood work. She has not had a gynecological exam and Pap smear in several years. Patient stated that many years ago she had abdominal hysterectomy for benign entity. Also many years ago she had been on hormone replacement therapy. She has an occasional vasomotor symptoms but not on a frequent basis. Review of her last bone density study in 2015 indicated she had osteopenia with lowest T score at the left femoral neck with a value of -1.6 and a negative Frax analysis. She also had a colonoscopy this year whereby polyps were removed. Her mammogram this year which she reports was normal. Patient prior to her hysterectomy and after reports no past history of any abnormal Pap smear. Patient interested in flu vaccine today. Patient was complaining today of a slight bulge sensation she feels a time when she standing. She denies any stress or any urgency incontinence. No GI complaints.  Past medical history,surgical history, family history and social history were all reviewed and documented in the EPIC chart.  Gynecologic History No LMP recorded. Patient is postmenopausal. Contraception: status post hysterectomy Last Pap: Several years ago. Results were: normal Last mammogram: 2017. Results were: normal  Obstetric History OB History  Gravida Para Term Preterm AB Living  3 2     1 2   SAB TAB Ectopic Multiple Live Births  1            # Outcome Date GA Lbr Len/2nd Weight Sex Delivery Anes PTL Lv  3 SAB           2 Para           1 Para                ROS: A ROS was performed and pertinent positives and negatives are included in the history.  GENERAL: No fevers or chills. HEENT: No change in vision, no earache, sore throat or sinus congestion. NECK: No pain or stiffness. CARDIOVASCULAR: No chest pain  or pressure. No palpitations. PULMONARY: No shortness of breath, cough or wheeze. GASTROINTESTINAL: No abdominal pain, nausea, vomiting or diarrhea, melena or bright red blood per rectum. GENITOURINARY: No urinary frequency, urgency, hesitancy or dysuria. MUSCULOSKELETAL: No joint or muscle pain, no back pain, no recent trauma. DERMATOLOGIC: No rash, no itching, no lesions. ENDOCRINE: No polyuria, polydipsia, no heat or cold intolerance. No recent change in weight. HEMATOLOGICAL: No anemia or easy bruising or bleeding. NEUROLOGIC: No headache, seizures, numbness, tingling or weakness. PSYCHIATRIC: No depression, no loss of interest in normal activity or change in sleep pattern.     Exam: chaperone present  BP 140/84   Ht 5' (1.524 m)   Wt 141 lb (64 kg)   BMI 27.54 kg/m   Body mass index is 27.54 kg/m.  General appearance : Well developed well nourished female. No acute distress HEENT: Eyes: no retinal hemorrhage or exudates,  Neck supple, trachea midline, no carotid bruits, no thyroidmegaly Lungs: Clear to auscultation, no rhonchi or wheezes, or rib retractions  Heart: Regular rate and rhythm, no murmurs or gallops Breast:Examined in sitting and supine position were symmetrical in appearance, no palpable masses or tenderness,  no skin retraction, no nipple inversion, no nipple discharge, no skin discoloration, no axillary or supraclavicular lymphadenopathy Abdomen:  no palpable masses or tenderness, no rebound or guarding Extremities: no edema or skin discoloration or tenderness  Pelvic:  Bartholin, Urethra, Skene Glands: Within normal limits             Vagina: First to second-degree cystocele  Cervix: Absent  Uterus  absent  Adnexa  Without masses or tenderness  Anus and perineum  normal   Rectovaginal  normal sphincter tone without palpated masses or tenderness             Hemoccult colonoscopy less then 12 months ago.     Assessment/Plan:  70 y.o. female for annual exam with  first to second-degree cystocele no urge or stress incontinence reported. We discussed different treatment options to include the following: #1*with Keagle exercises for which literature information was provided in Spanish #2 the use of vaginal pessary #3 surgical intervention such as with an anterior colporrhaphy  For her occasional infrequent hot flashes I would recommend she use peppermint oral to apply a couple of drops behind each ear on a when necessary basis. Also discussed with him for some calcium vitamin D and weightbearing exercises for osteoporosis prevention. She is due for bone density study later in November. Patient received the flu vaccine today. Literature information all the above was provided in Spanish and all questions are answered. Since we do not have any records of any previous Pap smear we went ahead and did a Pap smear with HPV screening today. We also discussed the new ASCP guidelines and she will no longer need Pap smears.   Ok Edwards MD, 3:59 PM 09/10/2016

## 2016-09-10 NOTE — Patient Instructions (Addendum)
Reparacin del cistocele (Cystocele Repair) La reparacin del cistocele es un procedimiento quirrgico para extirpar un cistocele que es un bulto, una zona que cae (hernia) de la vejiga y que se extiende hacia la vagina. Este abultamiento o protrusin se produce en la pared anterior de la vagina. INFORME A SU MDICO:   Cualquier alergia que tenga.  Todos los [employer] [place], incluyendo vitaminas, hierbas, gotas oftlmicas, cremas y [address] de [address].  Uso de corticoides (por va oral o cremas).  Problemas previos que usted o los [employer] de su familia hayan tenido con el uso de anestsicos.  Enfermedades de [employer].  Cirugas previas.  Padecimientos mdicos.  Posibilidad de embarazo, si correspondiera. RIESGOS Y COMPLICACIONES  Generalmente, ste es un procedimiento seguro. Sin embargo, Tree surgeon procedimiento, pueden surgir complicaciones. Las complicaciones posibles son:  Sharlyne Pacas.  Infeccin.  Lesin en los rganos circundantes.  Problemas relacionados con la anestesia. Los riesgos podrn variar segn el tipo de anestesia administrada.  Problemas con el catter urinario despus de la ciruga, como una obstruccin.  Reaparicin del cistocele. ANTES DEL PROCEDIMIENTO   Consulte a su mdico si debe cambiar o suspender los medicamentos que toma habitualmente. Es posible que deba dejar de tomar ciertos medicamentos antes de la [place].  No coma ni beba nada despus de la medianoche anterior a la ciruga.  Si fuma, no lo haga al [employer] previas a la [place].  No beba alcohol los [address] a la [place].  Pdale a alguna persona que la lleve a su casa despus de la hospitalizacin y que la ayude con las actividades durante la recuperacin. PROCEDIMIENTO   Le aplicarn un medicamento que la har dormir durante el procedimiento (anestesia general) o le inyectarn un medicamento para adormecer la zona de la cintura  para abajo (anestesia espinal) o anestesia epidural. Usted dormir o tendr el cuerpo adormecido durante todo el procedimiento.  Le colocarn un tubo delgado y flexible (catter Foley) en la vejiga para drenar la orina durante y despus de la [place].  Esta ciruga se realiza a travs de la vagina. La pared anterior de la vagina se abre, y el msculo que se encuentra entre la vejiga y la vagina se vuelve hacia su posicin normal. Esto se refuerza con puntos o un trozo de [place]. De este modo se extirpa la hernia y la parte superior de la vagina no caer en la abertura de la misma.  El corte en la pared anterior de la vagina se cierra con puntos que se reabsorben y no necesitan quitarse. DESPUS DEL PROCEDIMIENTO   La llevarn al rea de recuperacin donde controlarn su evolucin de cerca. Le controlarn con frecuencia la respiracin, la presin arterial y el pulso (signos vitales). Cuando se encuentre estable, ser llevada a una habitacin comn del hospital.  Tendr colocado un catter para drenar la vejiga. Este se Database administrator 2 a 7 das o hasta que la vejiga funcione adecuadamente por s misma.  Tendr Neomia Dear gasa en la vagina. Esta se retirar en 1 o 2 das despus de la [place].  Le darn medicamentos para calmar el dolor segn sea necesario y podrn indicarle medicamentos para destruir los grmenes (antibiticos).  Ser necesario que Administrator, Civil Service hospital durante 1 - 2 das.   Esta informacin no tiene Theme park manager el consejo del mdico. Asegrese de hacerle al mdico cualquier pregunta que tenga.   Document Released: 12/14/2005 Document Revised: 10/04/2013 Elsevier  Interactive Patient Education 2016 [employer]vier Inc.   Ejercicios de Kegel  (Kegel Exercises) El objetivo de los ejercicios de Kegel es aislar y Company secretaryejercitar los msculos del suelo plvico. Estos msculos actan como una hamaca que soporta el recto, la vagina, el intestino delgado y [place] tero. A medida que  los msculos se debilitan, se hunden y esos rganos son desplazados de sus posiciones normales. Los ejercicios de Kegel fortalecen los msculos del suelo plvico y ayudan a Careers information officermejorar el control de la vejiga y del intestino, mejoran la respuesta sexual y Biomedical scientistayudan a Lawyersolucionar muchos problemas y Chartered certified accountantalgunas molestias durante el Josephineembarazo. Se pueden hacer en cualquier lugar y en cualquier momento.  CMO REALIZAR LOS EJERCICIOS DE KEGEL  1. Localice los msculos del suelo plvico. Para ello apriete (contraiga) los msculos que se utilizan para tratar de TEFL teacherdetener el flujo de [place]. Usted se sentir una opresin en el rea vaginal (mujeres) y que se eleva y comprime la zona rectal (hombres y mujeres). 2. Para comenzar, contraiga los msculos plvicos durante 2 a 5 segundos y despus reljelos durante 2 a 5 segundos. Esta es una serie. Repita 4 a 5 series con una breve pausa en el medio. 3. Contraiga los msculos de la pelvis durante 8 a 10 segundos y despus reljelos durante 8 a 10 segundos. Repita 4 a 5 series. Si no puede contraer los msculos de la pelvis durante 8 a 10 segundos, trate de 5 a 7 segundos y Scientist, research (physical sciences)avance hasta lograr 8 a 10 segundos. Su objetivo es Education officer, environmentalrealizar 4 a 5 series de 10 Advice workercontracciones cada da. Mantenga el estmago, las nalgas y las piernas relajadas durante los ejercicios. Realice ambas series de contracciones, cortas y largas. Vare las posiciones. Haga las contracciones 3 a 4 veces por Futures traderda. Haga las series mientras est:    Acostado en la cama por la [place].  De pie en el almuerzo.  Sentado por las tardes.  Acostado en la cama por la noche.  Debe hacer 40 a 50 contracciones por da. No realice ms ejercicios de Kegel por da que lo recomendado. El exceso de ejercicio puede causar fatiga muscular. Contine con estos ejercicios durante al menos 15 a 20 semanas o segn las indicaciones de su mdico.    Esta informacin no tiene Theme park managercomo fin reemplazar el consejo del mdico. Asegrese de hacerle al  mdico cualquier pregunta que tenga.   Document Released: 11/30/2012 Elsevier Interactive Patient Education 2016 [employer]vier Inc. Influenza Virus Vaccine (Flucelvax) Qu es este medicamento? La VACUNA ANTIGRIPAL ayuda a disminuir el riesgo de contraer la influenza, tambin conocida como la gripe. La vacuna solo ayuda a protegerle contra algunas cepas de influenza. Este medicamento puede ser utilizado para otros usos; si tiene alguna pregunta consulte con su proveedor de atencin mdica o con su farmacutico. Qu le debo informar a mi profesional de la salud antes de tomar este medicamento? Necesita saber si usted presenta alguno de los siguientes problemas o situaciones: -trastorno de sangrado como hemofilia -fiebre o infeccin -sndrome de Guillain-Barre u otros problemas neurolgicos -problemas del sistema inmunolgico -infeccin por el virus de la inmunodeficiencia humana (VIH) o SIDA -niveles bajos de plaquetas en la sangre -esclerosis mltiple -una reaccin alrgica o inusual a las vacunas antigripales, a otros medicamentos, alimentos, colorantes o conservantes -si est embarazada o buscando quedar embarazada -si est amamantando a un beb Cmo debo utilizar este medicamento? Esta vacuna se administra mediante inyeccin por va intramuscular. Lo administra un profesional de Beazer Homesla salud. Recibir una copia de informacin escrita  sobre la vacuna antes de cada vacuna. Asegrese de leer este folleto cada vez cuidadosamente. Este folleto puede cambiar con frecuencia. Hable con su pediatra para informarse acerca del uso de este medicamento en nios. Puede requerir atencin especial. Sobredosis: Pngase en contacto inmediatamente con un centro toxicolgico o una sala de urgencia si usted cree que haya tomado demasiado medicamento. ATENCIN: [employer] es solo para usted. No comparta este medicamento con nadie. Qu sucede si me olvido de una dosis? No se aplica en este caso. Qu puede  interactuar con este medicamento? -quimioterapia o radioterapia -medicamentos que suprimen el sistema inmunolgico, tales como etanercept, anakinra, infliximab y adalimumab -medicamentos que tratan o previenen cogulos sanguneos, como warfarina -fenitona -medicamentos esteroideos, como la prednisona o la cortisona -teofilina -vacunas Puede ser que esta lista no menciona todas las posibles interacciones. Informe a su profesional de [employer] Homes de [employer] productos a base de hierbas, medicamentos de [place] o suplementos nutritivos que est tomando. Si usted fuma, consume bebidas alcohlicas o si utiliza drogas ilegales, indqueselo tambin a su profesional de [employer]. Algunas sustancias pueden interactuar con su medicamento. A qu debo estar atento al usar [employer]? Informe a su mdico o a Producer, television/film/video de la [employer] todos los efectos secundarios que persistan despus de [address]. Llame a su proveedor de atencin mdica si se presentan sntomas inusuales dentro de las 6 semanas de recibir esta vacuna. Es posible que todava pueda contraer la gripe, pero la enfermedad no ser tan fuerte como normalmente. No puede contraer la gripe de esta vacuna. La vacuna antigripal no le protege contra resfros u otras enfermedades que pueden causar [place]. Debe vacunarse cada ao. Qu efectos secundarios puedo tener al [employer] este medicamento? Efectos secundarios que debe informar a su mdico o a Producer, television/film/video de la salud tan pronto como sea posible: -reacciones alrgicas como erupcin cutnea, picazn o urticarias, hinchazn de la cara, labios o lengua Efectos secundarios que, por lo general, no requieren atencin mdica (debe informarlos a su mdico o a su profesional de la salud si persisten o si son molestos): -fiebre -dolor de cabeza -molestias y dolores musculares -dolor, sensibilidad, enrojecimiento o Paramedic de la inyeccin -cansancio Puede ser que esta lista no  menciona todos los posibles efectos secundarios. Comunquese a su mdico por asesoramiento mdico [employer]. Usted puede informar los efectos secundarios a la FDA por telfono al 1-800-FDA-1088. Dnde debo guardar mi medicina? Esta vacuna se administrar por un profesional de la salud en una [place], Corporate investment banker, consultorio mdico u otro consultorio de un profesional de la salud. No se le suministrar esta vacuna para guardar en su domicilio. ATENCIN: Este folleto es un resumen. Puede ser que no cubra toda la posible informacin. Si usted tiene preguntas acerca de esta medicina, consulte con su mdico, su farmacutico o su profesional de Radiographer, therapeutic.    2016, Elsevier/Gold Standard. (2011-11-30 16:29:16)

## 2016-09-14 LAB — PAP, TP IMAGING W/ HPV RNA, RFLX HPV TYPE 16,18/45: HPV mRNA, High Risk: NOT DETECTED

## 2016-10-12 ENCOUNTER — Other Ambulatory Visit: Payer: Commercial Managed Care - HMO

## 2016-10-12 LAB — FECAL OCCULT BLOOD, IMMUNOCHEMICAL: FECAL OCCULT BLD: NEGATIVE

## 2016-10-12 NOTE — Addendum Note (Signed)
Addended by: Eustace QuailEABOLD, Renner Sebald J on: 10/12/2016 11:10 AM   Modules accepted: Orders

## 2016-10-27 ENCOUNTER — Encounter: Payer: Self-pay | Admitting: Internal Medicine

## 2016-10-27 ENCOUNTER — Ambulatory Visit (INDEPENDENT_AMBULATORY_CARE_PROVIDER_SITE_OTHER): Payer: Commercial Managed Care - HMO | Admitting: Internal Medicine

## 2016-10-27 ENCOUNTER — Telehealth: Payer: Self-pay

## 2016-10-27 VITALS — BP 112/78 | HR 58 | Temp 97.7°F | Resp 12 | Ht 60.0 in | Wt 144.5 lb

## 2016-10-27 DIAGNOSIS — E039 Hypothyroidism, unspecified: Secondary | ICD-10-CM

## 2016-10-27 DIAGNOSIS — D649 Anemia, unspecified: Secondary | ICD-10-CM | POA: Diagnosis not present

## 2016-10-27 LAB — CBC WITH DIFFERENTIAL/PLATELET
Basophils Absolute: 0.1 10*3/uL (ref 0.0–0.1)
Basophils Relative: 1 % (ref 0.0–3.0)
EOS PCT: 5.3 % — AB (ref 0.0–5.0)
Eosinophils Absolute: 0.4 10*3/uL (ref 0.0–0.7)
HEMATOCRIT: 39.3 % (ref 36.0–46.0)
HEMOGLOBIN: 13.1 g/dL (ref 12.0–15.0)
LYMPHS ABS: 2.6 10*3/uL (ref 0.7–4.0)
LYMPHS PCT: 37.7 % (ref 12.0–46.0)
MCHC: 33.2 g/dL (ref 30.0–36.0)
MCV: 85.6 fl (ref 78.0–100.0)
MONOS PCT: 3.9 % (ref 3.0–12.0)
Monocytes Absolute: 0.3 10*3/uL (ref 0.1–1.0)
Neutro Abs: 3.7 10*3/uL (ref 1.4–7.7)
Neutrophils Relative %: 52.1 % (ref 43.0–77.0)
Platelets: 250 10*3/uL (ref 150.0–400.0)
RBC: 4.59 Mil/uL (ref 3.87–5.11)
RDW: 18.5 % — ABNORMAL HIGH (ref 11.5–15.5)
WBC: 7 10*3/uL (ref 4.0–10.5)

## 2016-10-27 LAB — IRON: Iron: 55 ug/dL (ref 42–145)

## 2016-10-27 LAB — FERRITIN: Ferritin: 13.1 ng/mL (ref 10.0–291.0)

## 2016-10-27 MED ORDER — LEVOTHYROXINE SODIUM 100 MCG PO TABS: 100.0000 ug | ORAL_TABLET | Freq: Every day | ORAL | 1 refills | Status: AC

## 2016-10-27 NOTE — Telephone Encounter (Signed)
Received records from Dr. Loreta AveMann. Records forwarded to PCP for review.

## 2016-10-27 NOTE — Patient Instructions (Signed)
GO TO THE LAB : Get the blood work     GO TO THE FRONT DESK Schedule your next appointment for a  Check up in 4-5 months   

## 2016-10-27 NOTE — Progress Notes (Signed)
Pre visit review using our clinic review tool, if applicable. No additional management support is needed unless otherwise documented below in the visit note. 

## 2016-10-27 NOTE — Progress Notes (Signed)
Subjective:    Patient ID: Angela Valencia, female    DOB: 09/28/1946, 70 y.o.   MRN: [id number]  DOS:  10/27/2016 Type of visit - description : rov Interval history: Anemia: Was recently found to have iron deficiency anemia, saw GI, extensive workup. Currently taking iron supplements and vitamins.    Review of Systems  Denies chest pain, difficulty breathing. No blood in the stools although the stools look different when she started to take iron by mouth.  Past Medical History:  Diagnosis Date  . Glaucoma   . Hypothyroidism   . Osteopenia   . Urolithiasis 2007,2009  . Vitamin D deficiency 09/30/2012    Past Surgical History:  Procedure Laterality Date  . ABDOMINAL HYSTERECTOMY  1990   no oophorectomy  . CESAREAN SECTION    . CHOLECYSTECTOMY    . [place]IC [place]PASS  [place]    Social History   Social History  . Marital status: Married    Spouse name: N/A  . Number of children: 2  . Years of education: N/A   Occupational History  . 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  [employer]   Social History Main Topics  . Smoking status: Never Smoker  . Smokeless tobacco: Never Used  . Alcohol use Yes     Comment: rarely  . Drug use: No  . Sexual activity: Yes    Partners: Male   Other Topics Concern  . Not on file   Social History Narrative   From [place], married to [place] x 40 years               Medication List       Accurate as of 10/27/16 11:59 PM. Always use your most recent med list.          CALCIUM + D PO Take by mouth.   CENTRUM SILVER PO Take 1 tablet by mouth daily.   ferrous sulfate 325 (65 FE) MG tablet Take 1 tablet (325 mg total) by mouth 2 (two) times daily with a meal.   levothyroxine 100 MCG tablet Commonly known as:  SYNTHROID, LEVOTHROID Take 1 tablet (100 mcg total) by mouth daily before breakfast.   vitamin B-12 250 MCG tablet Commonly known as:  CYANOCOBALAMIN Take 250 mcg by mouth daily.   Vitamin D3 2000 units  Tabs Take 2,000 mg by mouth daily.          Objective:   Physical Exam BP 112/78 (BP Location: Left Arm, Patient Position: Sitting, Cuff Size: Small)   Pulse (!) 58   Temp 97.7 F (36.5 C) (Oral)   Resp 12   Ht 5' (1.524 m)   Wt 144 lb 8 oz (65.5 kg)   SpO2 99%   BMI 28.22 kg/m   General:   Well developed, well nourished . NAD.  HEENT:  Normocephalic . Face symmetric, atraumatic Lungs:  CTA B Normal respiratory effort, no intercostal retractions, no accessory muscle use. Heart: RRR,  no murmur.  No pretibial edema bilaterally  Skin: Not pale. Not jaundice Neurologic:  alert & oriented X3.  Speech normal, gait appropriate for age and unassisted Psych--  Cognition and judgment appear intact.  Cooperative with normal attention span and concentration.  Behavior appropriate. No anxious or depressed appearing.      Assessment & Plan:  Assessment Hypothyroidism H/o  Insomnia H/o gastric Bypass 2005 H/o esophageal stricture EGD dilatation ~ 2013 Dr Loreta AveMann EGD performed 05/22/2013 while she was in [place]: Postsurgical  changes were observed, had a  Dilatation (at the yeyunum?) Osteopenia, vitamin D deficiency 12-2006 : T score -0.9 ;  05-2009 -1.5 ;  10-2014 T score -1.5 DJD Glaucoma   PLAN: Iron deficiency anemia: Since the last time she was here, saw GI, had a EGD, colonoscopy and blood work. Will get records. Continue iron, check CBC, iron and ferritin.Addendum: Records reviewed : Colonoscopy 01/26/2011: Normal EGD 01/26/2011: See report, pseudo diverticulum? 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 No dysplasia otherwise normal. Hypothyroidism: Refill medications RTC 4-5 months

## 2016-10-27 NOTE — Telephone Encounter (Signed)
Requested cscope/EGD reports from Dr. Kenna GilbertMann's office. Requested records be faxed to [phone]. Awaiting records.

## 2016-10-28 ENCOUNTER — Encounter: Payer: Self-pay | Admitting: Internal Medicine

## 2016-10-28 NOTE — Telephone Encounter (Signed)
Colonoscopy 01/26/2011: Normal EGD 01/26/2011: See report, pseudo diverticulum? 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 No dysplasia otherwise normal.

## 2016-10-28 NOTE — Assessment & Plan Note (Signed)
Iron deficiency anemia: Since the last time she was here, saw GI, had a EGD, colonoscopy and blood work. Will get records. Continue iron, check CBC, iron and ferritin.Addendum: Records reviewed : Colonoscopy 01/26/2011: Normal EGD 01/26/2011: See report, pseudo diverticulum? 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 No dysplasia otherwise normal. Hypothyroidism: Refill medications RTC 4-5 months

## 2016-12-02 ENCOUNTER — Telehealth: Payer: Self-pay | Admitting: Internal Medicine

## 2016-12-02 NOTE — Telephone Encounter (Signed)
Caller name: Vonda AntiguaRosario M Bains Relation to pt: self  Call back number: [phone] Pharmacy:  Reason for call: Pt called stating she is needing her medical records ASAP since [place] Friday [place] pt came in office on Monday and filled out Medical Release Form which was sent to Medical Records Department, pt called [facility] and they still have not sent out the medical records to our office ([place] [place]-but can come to our office). Pt states that her husband came to visit Dr Drue NovelPaz and that Dr Drue NovelPaz printed out his medical records the same day, pt would like to know if Dr Drue NovelPaz can print out her medical records (at least the most important of this year with her test studies, immunization records and x-ray). Pt insisted that Dr Drue NovelPaz could print out for her since her husband got his medical from him and keep on insisting to have it done here since she has a lot going on. Pt also stated if possible for today 12-02-16 to have it printed out since she is leaving this Friday 12-04-16. Please advise ASAP.

## 2016-12-02 NOTE — Telephone Encounter (Signed)
Records ready for pick up (included was last year of OV notes w/ PCP, labs, EKG, last EGD and cscope reports, and immunization records).

## 2017-03-29 ENCOUNTER — Ambulatory Visit: Payer: Commercial Managed Care - HMO | Admitting: Internal Medicine

## 2017-05-12 ENCOUNTER — Encounter: Payer: Self-pay | Admitting: Gynecology

## 2019-04-06 ENCOUNTER — Telehealth: Payer: Self-pay

## 2019-04-06 NOTE — Telephone Encounter (Signed)
Last ov 2017[name]- can you try calling Pt to set up virtual visit?

## 2019-04-06 NOTE — Telephone Encounter (Signed)
Spoke with pt and pt stated moved to [place] since two years ago.
